# Patient Record
Sex: Male | Born: 1960 | Race: White | Hispanic: No | Marital: Married | Smoking: Former smoker
Health system: Southern US, Community
[De-identification: ages and names within clinical notes are randomized; demographics above are authoritative.]

## PROBLEM LIST (undated history)

## (undated) DIAGNOSIS — E785 Hyperlipidemia, unspecified: Secondary | ICD-10-CM

## (undated) DIAGNOSIS — G709 Myoneural disorder, unspecified: Secondary | ICD-10-CM

## (undated) DIAGNOSIS — M1990 Unspecified osteoarthritis, unspecified site: Secondary | ICD-10-CM

## (undated) DIAGNOSIS — K219 Gastro-esophageal reflux disease without esophagitis: Secondary | ICD-10-CM

## (undated) DIAGNOSIS — I10 Essential (primary) hypertension: Secondary | ICD-10-CM

## (undated) DIAGNOSIS — T7840XA Allergy, unspecified, initial encounter: Secondary | ICD-10-CM

## (undated) HISTORY — PX: ABDOMINAL HERNIA REPAIR: SHX539

## (undated) HISTORY — PX: SHOULDER SURGERY: SHX246

## (undated) HISTORY — DX: Allergy, unspecified, initial encounter: T78.40XA

## (undated) HISTORY — DX: Hyperlipidemia, unspecified: E78.5

## (undated) HISTORY — PX: SHOULDER ARTHROSCOPY: SHX128

## (undated) HISTORY — DX: Unspecified osteoarthritis, unspecified site: M19.90

---

## 1998-09-18 ENCOUNTER — Encounter: Admission: RE | Admit: 1998-09-18 | Discharge: 1998-09-18 | Payer: Self-pay | Admitting: Family Medicine

## 1999-02-24 ENCOUNTER — Encounter: Admission: RE | Admit: 1999-02-24 | Discharge: 1999-02-24 | Payer: Self-pay | Admitting: Family Medicine

## 1999-09-10 ENCOUNTER — Encounter: Admission: RE | Admit: 1999-09-10 | Discharge: 1999-09-10 | Payer: Self-pay | Admitting: Family Medicine

## 1999-10-06 ENCOUNTER — Encounter: Admission: RE | Admit: 1999-10-06 | Discharge: 1999-10-06 | Payer: Self-pay | Admitting: Sports Medicine

## 1999-12-22 ENCOUNTER — Encounter: Admission: RE | Admit: 1999-12-22 | Discharge: 1999-12-22 | Payer: Self-pay | Admitting: Family Medicine

## 2000-10-27 ENCOUNTER — Encounter: Admission: RE | Admit: 2000-10-27 | Discharge: 2000-10-27 | Payer: Self-pay | Admitting: Family Medicine

## 2001-04-18 ENCOUNTER — Encounter: Admission: RE | Admit: 2001-04-18 | Discharge: 2001-04-18 | Payer: Self-pay | Admitting: Family Medicine

## 2001-05-10 ENCOUNTER — Encounter: Admission: RE | Admit: 2001-05-10 | Discharge: 2001-05-10 | Payer: Self-pay | Admitting: Family Medicine

## 2001-05-25 ENCOUNTER — Encounter: Admission: RE | Admit: 2001-05-25 | Discharge: 2001-05-25 | Payer: Self-pay | Admitting: Family Medicine

## 2002-09-04 ENCOUNTER — Encounter: Admission: RE | Admit: 2002-09-04 | Discharge: 2002-09-04 | Payer: Self-pay | Admitting: Family Medicine

## 2002-09-05 ENCOUNTER — Encounter: Admission: RE | Admit: 2002-09-05 | Discharge: 2002-09-05 | Payer: Self-pay | Admitting: Family Medicine

## 2004-09-15 ENCOUNTER — Ambulatory Visit: Payer: Self-pay | Admitting: Family Medicine

## 2004-09-24 ENCOUNTER — Ambulatory Visit: Payer: Self-pay | Admitting: Family Medicine

## 2005-05-04 ENCOUNTER — Ambulatory Visit (HOSPITAL_COMMUNITY): Admission: RE | Admit: 2005-05-04 | Discharge: 2005-05-04 | Payer: Self-pay | Admitting: Family Medicine

## 2005-05-04 ENCOUNTER — Ambulatory Visit: Payer: Self-pay | Admitting: Family Medicine

## 2005-06-09 ENCOUNTER — Ambulatory Visit (HOSPITAL_COMMUNITY): Admission: RE | Admit: 2005-06-09 | Discharge: 2005-06-09 | Payer: Self-pay | Admitting: Sports Medicine

## 2005-12-09 ENCOUNTER — Ambulatory Visit: Payer: Self-pay | Admitting: Family Medicine

## 2006-03-10 DIAGNOSIS — L2089 Other atopic dermatitis: Secondary | ICD-10-CM

## 2006-03-10 DIAGNOSIS — K219 Gastro-esophageal reflux disease without esophagitis: Secondary | ICD-10-CM

## 2007-03-02 ENCOUNTER — Ambulatory Visit: Payer: Self-pay | Admitting: Family Medicine

## 2007-03-02 DIAGNOSIS — I10 Essential (primary) hypertension: Secondary | ICD-10-CM | POA: Insufficient documentation

## 2007-03-02 LAB — CONVERTED CEMR LAB
Bilirubin Urine: NEGATIVE
Blood in Urine, dipstick: NEGATIVE
Ketones, urine, test strip: NEGATIVE
Urobilinogen, UA: 0.2
WBC Urine, dipstick: NEGATIVE
pH: 7

## 2007-06-22 ENCOUNTER — Ambulatory Visit: Payer: Self-pay | Admitting: Family Medicine

## 2007-06-22 DIAGNOSIS — M25519 Pain in unspecified shoulder: Secondary | ICD-10-CM

## 2007-07-17 ENCOUNTER — Telehealth: Payer: Self-pay | Admitting: *Deleted

## 2007-07-20 ENCOUNTER — Ambulatory Visit: Payer: Self-pay | Admitting: Family Medicine

## 2007-07-20 DIAGNOSIS — R252 Cramp and spasm: Secondary | ICD-10-CM | POA: Insufficient documentation

## 2007-07-20 LAB — CONVERTED CEMR LAB
AST: 19 units/L (ref 0–37)
Albumin: 4.6 g/dL (ref 3.5–5.2)
Calcium: 9.6 mg/dL (ref 8.4–10.5)
Glucose, Bld: 87 mg/dL (ref 70–99)
Total Bilirubin: 0.6 mg/dL (ref 0.3–1.2)
Total CHOL/HDL Ratio: 3.6
Total Protein: 7.2 g/dL (ref 6.0–8.3)
VLDL: 25 mg/dL (ref 0–40)

## 2008-03-05 ENCOUNTER — Encounter: Payer: Self-pay | Admitting: Family Medicine

## 2008-03-05 ENCOUNTER — Ambulatory Visit: Payer: Self-pay | Admitting: Family Medicine

## 2008-12-20 ENCOUNTER — Ambulatory Visit: Payer: Self-pay | Admitting: Family Medicine

## 2009-01-31 ENCOUNTER — Ambulatory Visit: Payer: Self-pay | Admitting: Family Medicine

## 2009-02-17 ENCOUNTER — Ambulatory Visit: Payer: Self-pay | Admitting: Family Medicine

## 2009-05-28 ENCOUNTER — Ambulatory Visit: Payer: Self-pay

## 2009-05-28 ENCOUNTER — Ambulatory Visit: Payer: Self-pay | Admitting: Family Medicine

## 2009-05-28 ENCOUNTER — Ambulatory Visit (HOSPITAL_COMMUNITY): Admission: RE | Admit: 2009-05-28 | Discharge: 2009-05-28 | Payer: Self-pay | Admitting: Family Medicine

## 2009-06-02 ENCOUNTER — Encounter: Payer: Self-pay | Admitting: Family Medicine

## 2009-06-02 LAB — CONVERTED CEMR LAB
AST: 17 units/L (ref 0–37)
BUN: 18 mg/dL (ref 6–23)
Bilirubin Urine: NEGATIVE
Blood in Urine, dipstick: NEGATIVE
CO2: 24 meq/L (ref 19–32)
Calcium: 9.1 mg/dL (ref 8.4–10.5)
Chloride: 104 meq/L (ref 96–112)
Glucose, Urine, Semiquant: NEGATIVE
Nitrite: NEGATIVE
PSA: 0.3 ng/mL (ref 0.10–4.00)
Potassium: 4.1 meq/L (ref 3.5–5.3)
Protein, U semiquant: NEGATIVE
Specific Gravity, Urine: 1.02
Total Protein: 6.9 g/dL (ref 6.0–8.3)

## 2009-06-03 ENCOUNTER — Encounter: Payer: Self-pay | Admitting: Family Medicine

## 2009-06-04 ENCOUNTER — Ambulatory Visit (HOSPITAL_COMMUNITY): Admission: RE | Admit: 2009-06-04 | Discharge: 2009-06-04 | Payer: Self-pay | Admitting: Family Medicine

## 2009-06-17 ENCOUNTER — Telehealth: Payer: Self-pay | Admitting: Family Medicine

## 2009-06-18 ENCOUNTER — Telehealth: Payer: Self-pay | Admitting: Family Medicine

## 2009-06-18 DIAGNOSIS — M719 Bursopathy, unspecified: Secondary | ICD-10-CM

## 2009-06-18 DIAGNOSIS — M67919 Unspecified disorder of synovium and tendon, unspecified shoulder: Secondary | ICD-10-CM | POA: Insufficient documentation

## 2009-06-24 ENCOUNTER — Encounter: Payer: Self-pay | Admitting: Family Medicine

## 2009-07-31 ENCOUNTER — Ambulatory Visit: Payer: Self-pay | Admitting: Family Medicine

## 2009-07-31 ENCOUNTER — Telehealth: Payer: Self-pay | Admitting: Family Medicine

## 2009-07-31 DIAGNOSIS — R358 Other polyuria: Secondary | ICD-10-CM

## 2009-07-31 LAB — CONVERTED CEMR LAB
Ketones, urine, test strip: NEGATIVE
Specific Gravity, Urine: 1.02
pH: 6.5

## 2009-11-21 ENCOUNTER — Encounter: Payer: Self-pay | Admitting: Family Medicine

## 2010-02-10 NOTE — Assessment & Plan Note (Signed)
Summary: cpe,df   Vital Signs:  Patient profile:   50 year old male Height:      70 inches Weight:      186 pounds BMI:     26.78 BSA:     2.03 Temp:     98.6 degrees F Pulse rate:   91 / minute BP sitting:   140 / 86  Vitals Entered By: Jone Baseman CMA (Jun 02, 2009 11:47 AM) CC: CPE Is Patient Diabetic? No Pain Assessment Patient in pain? yes     Location: rigth shoulder Intensity: 5   CC:  CPE.  History of Present Illness: Feels well except for shoulder - see notes  HYPERTENSION Disease Monitoring   Blood pressure range:usally controlled at most office vsits and when checks at home       Chest pain: N     Dyspnea:N Medications   Compliance: never started HCTZ that was Rxed 2 years ago   Lightheadedness: N     Edema:N Prevention   Exercise: at work    Has lost 5-10 lbs since diagnosed as hypertension  Reflux controlled. Takes tums as needed.  No bleeding or food sticking or unitentional weight loss  ROS - as above PMH - Medications reviewed and updated in medication list.  Smoking Status noted in VS form      Habits & Providers  Alcohol-Tobacco-Diet     Tobacco Status: never  Current Medications (verified): 1)  None  Allergies: No Known Drug Allergies  Review of Systems  The patient denies anorexia, fever, weight gain, vision loss, decreased hearing, hoarseness, chest pain, syncope, dyspnea on exertion, peripheral edema, prolonged cough, headaches, hemoptysis, abdominal pain, melena, hematochezia, severe indigestion/heartburn, hematuria, incontinence, genital sores, muscle weakness, suspicious skin lesions, transient blindness, difficulty walking, depression, unusual weight change, abnormal bleeding, enlarged lymph nodes, angioedema, and testicular masses.    Physical Exam  General:  Well-developed,well-nourished,in no acute distress; alert,appropriate and cooperative throughout examination Head:  Normocephalic and atraumatic without obvious  abnormalities. No apparent alopecia or balding. Ears:  External ear exam shows no significant lesions or deformities.  Otoscopic examination reveals clear canals, tympanic membranes are intact bilaterally without bulging, retraction, inflammation or discharge. Hearing is grossly normal bilaterally. Nose:  External nasal examination shows no deformity or inflammation. Nasal mucosa are pink and moist without lesions or exudates. Mouth:  Oral mucosa and oropharynx without lesions or exudates.  Teeth in good repair. Neck:  No deformities, masses, or tenderness noted. Lungs:  Normal respiratory effort, chest expands symmetrically. Lungs are clear to auscultation, no crackles or wheezes. Heart:  Normal rate and regular rhythm. S1 and S2 normal without gallop, murmur, click, rub or other extra sounds. Abdomen:  Bowel sounds positive,abdomen soft and non-tender without masses, organomegaly or hernias noted. Rectal:  No external abnormalities noted. Normal sphincter tone. No rectal masses or tenderness. Genitalia:  Testes bilaterally descended without nodularity, tenderness or masses. No scrotal masses or lesions. No penis lesions or urethral discharge. Prostate:  Prostate gland firm and smooth, no enlargement, nodularity, tenderness, mass, asymmetry or induration. Msk:  No deformity or scoliosis noted of thoracic or lumbar spine.   Extremities:  No clubbing, cyanosis, edema, or deformity noted with normal full range of motion of all joints.   Neurologic:  CN 2-7 Normal.  Upper Extremity strength 5/5.  Able to walk on tip toes and heels.  Romberg normal.  Finger to Nose normal  Skin:  Intact without suspicious lesions or rashes Cervical Nodes:  No lymphadenopathy  noted Inguinal Nodes:  No significant adenopathy   Impression & Recommendations:  Problem # 1:  PHYSICAL EXAMINATION (ICD-V70.0)  normal exam.  Filled out drivers form.  Refer for colonoscopy   Orders: [facility] - Est  40-64 yrs  ([address])  Problem # 2:  HYPERTENSION, BENIGN ESSENTIAL (ICD-401.1) seems to be controlled with diet and weight loss .  Will check labs  The following medications were removed from the medication list:    Hydrochlorothiazide 25 Mg Tabs (Hydrochlorothiazide) .Marland Kitchen... Take 1 tab by mouth every morning  Orders: Comp Met-[facility] (60454-09811) Urinalysis-[facility] (00000)  BP today: 140/86 Prior BP: 131/86 (05/28/2009)  Labs Reviewed: K+: 4.7 (07/20/2007) Creat: : 0.80 (07/20/2007)   Chol: 200 (07/20/2007)   HDL: 55 (07/20/2007)   LDL: 120 (07/20/2007)   TG: 126 (07/20/2007)  Other Orders: PSA-[facility] ([id number]) Tdap => 45yrs IM ([address]) Admin 1st Vaccine (57846)  Patient Instructions: 1)  Everything looks healthy 2)  I will send you a letter with your blood test results if you donot hear from me in 2 weeks call us 3)  Keep working on your weight- you've made good progress 4)  Check your blood pressure every so often if regularly > 140/90 come see [place] 5)  You need a colonoscopy - call the number of the sheet   Prevention & Chronic Care Immunizations   Influenza vaccine: Not documented    Tetanus booster: 06/02/2009: Tdap   Tetanus booster due: 09/11/2008    Pneumococcal vaccine: Not documented  Colorectal Screening   Hemoccult: Not documented    Colonoscopy: Not documented  Other Screening   PSA: Not documented   PSA ordered.   Smoking status: never  (06/02/2009)  Lipids   Total Cholesterol: 200  (07/20/2007)   LDL: 120  (07/20/2007)   LDL Direct: Not documented   HDL: 55  (07/20/2007)   Triglycerides: 126  (07/20/2007)  Hypertension   Last Blood Pressure: 140 / 86  (06/02/2009)   Serum creatinine: 0.80  (07/20/2007)   Serum potassium 4.7  (07/20/2007) CMP ordered     Hypertension flowsheet reviewed?: Yes   Progress toward BP goal: At goal  Self-Management Support :    Hypertension self-management support: Not documented   Laboratory Results   Urine  Tests  Date/Time Received: Jun 02, 2009 12:16 PM  Date/Time Reported: Jun 02, 2009 12:57 PM   Routine Urinalysis   Color: yellow Appearance: Clear Glucose: negative   (Normal Range: Negative) Bilirubin: negative   (Normal Range: Negative) Ketone: negative   (Normal Range: Negative) Spec. Gravity: 1.020   (Normal Range: 1.003-1.035) Blood: negative   (Normal Range: Negative) pH: 5.5   (Normal Range: 5.0-8.0) Protein: negative   (Normal Range: Negative) Urobilinogen: 0.2   (Normal Range: 0-1) Nitrite: negative   (Normal Range: Negative) Leukocyte Esterace: negative   (Normal Range: Negative)    Comments: ...............test performed by......Marland KitchenBonnie A. [place], MLS (ASCP)cm      Immunizations Administered:  Tetanus Vaccine:    Vaccine Type: Tdap    Site: left deltoid    Mfr: GlaxoSmithKline    Dose: 0.5 ml    Route: IM    Given by: Gladstone Pih    Exp. Date: 04/05/2011    Lot #: [id number]    VIS given: 11/29/06 version given Jun 02, 2009.    Physician counseled: yes

## 2010-02-10 NOTE — Assessment & Plan Note (Signed)
Summary: U/S SHOULDER,MC   History of Present Illness: 50 y/o male presents with bilateral shoulder pain, R>L.  Right shoulder has hx of trauma from car falling him while working on it in 1983, fell on his side. 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  Patient several cortisone shots in Right shoulder over the years but without much improvement or relief.  Patients pain is a 10/10 at its worst, constant achey pain with occational sharp pains.  Awakens from sleep at night.  Trouble with abduction motions.  Pain increases with prolonged use.  PCP prescribed Diclofenac 75mg  two times a day as needed for pain.  Also states that is sore in trapezius muscles.  Left shoulder is less problematic and is a 2/10 at its worst.    Allergies: No Known Drug Allergies  Review of Systems  The patient denies weight loss and weight gain.    Physical Exam  General:  alert and well-nourished.   Msk:  Bilateral Chest muscles: mild atrophy of pectoralis muscles noted B but intact function. -NT to palpation  Right Shoulder: -Tender to palpation anterior glenohumeral joint  -range of motion limited to 90 degrees in abduction, internal rotation combined with extension limited compared to left side.  -5/5 strength, but pain with all motions -Pain with lift off test and empty can test  Left Shoulder: -NT to palpation -FROM -5/5 strength  Additional Exam:  [place]. Supraspinatus and infraspinatus are intact and normal in apperance. The subscapularis is fatty infiltrated diffusely. No impingement seen on arm motion. Bicep tendon is intact.   Impression & Recommendations:  Problem # 1:  SHOULDER, PAIN (ICD-719.41)  B shoulder pain with intact rotator cuff muscles on clinical exam and [place] of right. . Will start aggressive rotator cuff strengthening program. Also add push ups as he has some slight atrophy of pec major by clinical exam. continue NSAID as needed. RTC 1  m  Orders: US EXTREMITY NON-VASC REAL-TIME IMG ([address])  Complete Medication List: 1)  Cialis 10 Mg Tabs (Tadalafil) .Marland Kitchen.. 1 by mouth a day as needed 2)  Hydrochlorothiazide 25 Mg Tabs (Hydrochlorothiazide) .... Take 1 tab by mouth every morning 3)  Prednisone 20 Mg Tabs (Prednisone) .... Sig: take 2 tabs by mouth once daily for 5 days 4)  Diclofenac Sodium 75 Mg Tbec (Diclofenac sodium) .... One by mouth two times a day for pain as needed - take with food

## 2010-02-10 NOTE — Consult Note (Signed)
Summary: [facility]   Imported By: Clydell Hakim 07/10/2009 16:32:31  _____________________________________________________________________  External Attachment:    Type:   Image     Comment:   External Document

## 2010-02-10 NOTE — Assessment & Plan Note (Signed)
Summary: shoulder pain,tcb   Vital Signs:  Patient profile:   50 year old male Height:      70 inches Weight:      185.1 pounds BMI:     26.66 Temp:     97.4 degrees F oral Pulse rate:   78 / minute BP sitting:   131 / 86  (left arm)  Vitals Entered By: Gladstone Pih (May 28, 2009 8:59 AM) CC: C/O right shoulder pain Is Patient Diabetic? No Pain Assessment Patient in pain? yes     Location: shoulder Intensity: 7 Type: aching   CC:  C/O right shoulder pain.  History of Present Illness: f/u shoulder pain--no better after injection and with use of NSAIDs, HEP. , Patients pain is a 10/10 at its worst, constant achey pain with occational sharp pains.  Awakens from sleep at night.  Trouble with overhead and abduction movements.  Habits & Providers  Alcohol-Tobacco-Diet     Tobacco Status: never  Current Medications (verified): 1)  Cialis 10 Mg  Tabs (Tadalafil) .Marland Kitchen.. 1 By Mouth A Day As Needed 2)  Hydrochlorothiazide 25 Mg  Tabs (Hydrochlorothiazide) .... Take 1 Tab By Mouth Every Morning 3)  Prednisone 20 Mg Tabs (Prednisone) .... Sig: Take 2 Tabs By Mouth Once Daily For 5 Days 4)  Diclofenac Sodium 75 Mg Tbec (Diclofenac Sodium) .... One By Mouth Two Times A Day For Pain As Needed - Take With Food  Allergies: No Known Drug Allergies  Physical Exam  General:  alert and well-developed.   Msk:  R shoulder pain with supraspiatis testing, decreased IR with difficulty performing lift off test on right. Positive Obriens and positive impingement signs Bicep, deltoid muscle strengh iontact.  Distally NV intact   Impression & Recommendations:  Problem # 1:  SHOULDER, PAIN (ICD-719.41)  His updated medication list for this problem includes:    Diclofenac Sodium 75 Mg Tbec (Diclofenac sodium) ..... One by mouth two times a day for pain as needed - take with food  Orders: Radiology other (Radiology Other) [facility]- Est Level  3 ([address]) will get MR Arthrogram to rule out labral  tear and other pathology f/u afte imaging  Complete Medication List: 1)  Cialis 10 Mg Tabs (Tadalafil) .Marland Kitchen.. 1 by mouth a day as needed 2)  Hydrochlorothiazide 25 Mg Tabs (Hydrochlorothiazide) .... Take 1 tab by mouth every morning 3)  Prednisone 20 Mg Tabs (Prednisone) .... Sig: take 2 tabs by mouth once daily for 5 days 4)  Diclofenac Sodium 75 Mg Tbec (Diclofenac sodium) .... One by mouth two times a day for pain as needed - take with food

## 2010-02-10 NOTE — Assessment & Plan Note (Signed)
Summary: "urinary symptoms"/[place]   Vital Signs:  Patient profile:   50 year old male Weight:      189 pounds Temp:     98 degrees F oral Pulse rate:   93 / minute BP sitting:   131 / 92  (right arm)  Vitals Entered By: Arlyss Repress CMA, (July 31, 2009 9:49 AM) CC: increased urination x saturday.  Is Patient Diabetic? No Pain Assessment Patient in pain? no        Primary Care Provider:  Pearlean Brownie MD  CC:  increased urination x saturday. Marland Kitchen  History of Present Illness: 50 yo here for more frequent urination.  Has happened since saturday.  He had day durgery for rotatior cuff repair on Friday under general anesthesia.  Has not had uruinary frequency before.  No incontinence, urge, dysuria, hematuria, flank pain.  he wonders if it could be the pain pills he has gotten.  Habits & Providers  Alcohol-Tobacco-Diet     Tobacco Status: quit     Year Quit: 2004  Current Medications (verified): 1)  None  Allergies: No Known Drug Allergies PMH-FH-SH reviewed-no changes except otherwise noted  Social History: Smoking Status:  quit  Review of Systems      See HPI  Physical Exam  General:  Well-developed,well-nourished,in no acute distress; alert,appropriate and cooperative throughout examination Msk:  right shoulder in sling, steristrips in place over shoulder.   Impression & Recommendations:  Problem # 1:  POLYURIA (ICD-788.42) U/A neg for infection.  Given this is first time he has had symptoms and is immediately post surgery- probably due to post-anesthesia/IVF.  Explained that I didnt think it was due to pain meds.  If continues to have polyuria would come back to see MD to further workup for other causes. Orders: Urinalysis-[facility] (00000) [facility]- Est Level  3 [phone])  Laboratory Results   Urine Tests  Date/Time Received: July 31, 2009 9:59 AM  Date/Time Reported: July 31, 2009 10:07 AM   Routine Urinalysis   Color: yellow Appearance:  Clear Glucose: negative   (Normal Range: Negative) Bilirubin: negative   (Normal Range: Negative) Ketone: negative   (Normal Range: Negative) Spec. Gravity: 1.020   (Normal Range: 1.003-1.035) Blood: negative   (Normal Range: Negative) pH: 6.5   (Normal Range: 5.0-8.0) Protein: negative   (Normal Range: Negative) Urobilinogen: 0.2   (Normal Range: 0-1) Nitrite: negative   (Normal Range: Negative) Leukocyte Esterace: negative   (Normal Range: Negative)    Comments: ...............test performed by......Marland KitchenBonnie A. [place], MLS (ASCP)cm

## 2010-02-10 NOTE — Miscellaneous (Signed)
  Clinical Lists Changes  Problems: Removed problem of PHYSICAL EXAMINATION (ICD-V70.0) Removed problem of CHOLESTEROL, SCREENING (ICD-V77.91) Removed problem of FAMILY HISTORY OF CAD MALE 1ST DEGREE RELATIVE <50 (ICD-V17.3) Removed problem of SCIATICA, ACUTE (ICD-724.3)

## 2010-02-10 NOTE — Letter (Signed)
Summary: Generic Letter  [facility]  [address]   [place], [place] [address]   Phone: [phone]  Fax: [phone]    06/03/2009  David Carrillo [address] [place], [place]  [address]  Dear [name],  Glad to say all your blood tests were normal.     Hope the MRI will show something that can be fixed.   Keep monitoring your blood pressure and weight.  Take care   Sincerely,   Pearlean Brownie MD  Appended Document: Generic Letter mailed.

## 2010-02-10 NOTE — Progress Notes (Signed)
  Phone Note Outgoing Call   Summary of Call: left mssg for him at work the other day and he has not called back left message today on home answering machine for him to call re MRI results  Denny Levy MD  June 17, 2009 10:27 AM

## 2010-02-10 NOTE — Progress Notes (Signed)
       Additional Follow-up for Phone Call Additional follow up Details #2::    staff from his ortho md office called to ask [place] to make an appt for him due to urinary problems. she was not sure of the details. asked her to tell him to come right over to see work in md Follow-up by: Golden Circle RN,  July 31, 2009 9:21 AM

## 2010-02-10 NOTE — Progress Notes (Signed)
Summary: phn msg  Phone Note Call from Patient Call back at Work Phone [phone]   Caller: Patient Summary of Call: Pt returning Dr. Donnetta Hail call regarding MRI results. Initial call taken by: Clydell Hakim,  June 18, 2009 9:51 AM  Follow-up for Phone Call        to Dr Jennette Kettle Follow-up by: Gladstone Pih,  June 18, 2009 11:19 AM  Additional Follow-up for Phone Call Additional follow up Details #1::        spoke w him re MRI--will send to ortho for eval--unclear to me if any surgical intervention would be helpful but he would like to pursue. will set up ortho  Denny Levy MD  June 19, 2009 9:54 AM   New Problems: ROTATOR CUFF INJURY, RIGHT SHOULDER (ICD-726.10)   New Problems: ROTATOR CUFF INJURY, RIGHT SHOULDER (ICD-726.10)

## 2010-02-10 NOTE — Assessment & Plan Note (Signed)
Summary: shoulder pain,tcb   Vital Signs:  Patient profile:   50 year old male Height:      70 inches Weight:      186.9 pounds BMI:     26.91 Temp:     98.0 degrees F oral Pulse rate:   89 / minute BP sitting:   145 / 82  (left arm) Cuff size:   regular  Vitals Entered By: Garen Grams LPN (January 31, 2009 3:17 PM) CC: rt shoulder pain Is Patient Diabetic? No Pain Assessment Patient in pain? yes     Location: rt shoulder Intensity: 3   CC:  rt shoulder pain.  History of Present Illness: 1. R shoulder pain Pt seen 12/10 by me for same issue. States steroid injection helped for a week or so but the pain is persistent and not improving. Has pain mostly with overhead activities and when he lays on that shoulder at night. Takes an occasional BC powder but doesn't notice much of a difference. No other pain medication.   Habits & Providers  Alcohol-Tobacco-Diet     Tobacco Status: never  Allergies: No Known Drug Allergies  Physical Exam  General:  VS reviewed, slightly hypertensive. Well-developed,well-nourished,in no acute distress; alert,appropriate and cooperative throughout examination Msk:  Shoulder: Inspection reveals no abnormalities, atrophy or asymmetry. Palpation is normal with no tenderness over AC joint or bicipital groove. ROM is full in all planes. + pain with empty can. + pain with Hawkins.  + painful arc. No drop arm sign +/- apprehension sign       Impression & Recommendations:  Problem # 1:  SHOULDER, PAIN (ICD-719.41) Assessment Unchanged + signs of impingement. Rotator cuff syndrome vs. chronic arthritis with previous injury. No signs of frozen shoulder at this point. Given chronic issue and failure to improve with injections, will refer to sports medicine. Ultrasound may help to elucidate pathology and guide therapy. Will start NSAID now. Pt has HTN so would not want to continue this beyond a few weeks.   His updated medication list for  this problem includes:    Diclofenac Sodium 75 Mg Tbec (Diclofenac sodium) ..... One by mouth two times a day for pain as needed - take with food  Orders: Sports Medicine (Sports Med) [facility]- Est Level  3 ([address])  Complete Medication List: 1)  Cialis 10 Mg Tabs (Tadalafil) .Marland Kitchen.. 1 by mouth a day as needed 2)  Hydrochlorothiazide 25 Mg Tabs (Hydrochlorothiazide) .... Take 1 tab by mouth every morning 3)  Prednisone 20 Mg Tabs (Prednisone) .... Sig: take 2 tabs by mouth once daily for 5 days 4)  Diclofenac Sodium 75 Mg Tbec (Diclofenac sodium) .... One by mouth two times a day for pain as needed - take with food  Patient Instructions: 1)  Call [phone]) to set  up an appointment with the sports medicine clinic. They may do an ultrasound to get a better idea of what may be causing you pain. 2)  In the meantime, take the diclofenac with pain. Take with food. If it irritates your stomach, call and we can try something different.  Prescriptions: DICLOFENAC SODIUM 75 MG TBEC (DICLOFENAC SODIUM) one by mouth two times a day for pain as needed - take with food  #45 x 0   Entered and Authorized by:   Myrtie Soman  MD   Signed by:   Myrtie Soman  MD on 01/31/2009   Method used:   Electronically to        [facility]  [address] [phone]* (retail)       [address]       [place], [place]  [id number]       Ph: [phone] or [phone]       Fax: [phone]   RxID:   [id number]

## 2011-06-14 ENCOUNTER — Encounter: Payer: Self-pay | Admitting: Family Medicine

## 2011-07-12 ENCOUNTER — Ambulatory Visit (INDEPENDENT_AMBULATORY_CARE_PROVIDER_SITE_OTHER): Payer: 59 | Admitting: Family Medicine

## 2011-07-12 ENCOUNTER — Encounter: Payer: Self-pay | Admitting: Family Medicine

## 2011-07-12 VITALS — BP 139/92 | HR 106 | Temp 98.2°F | Ht 70.0 in | Wt 195.0 lb

## 2011-07-12 DIAGNOSIS — I10 Essential (primary) hypertension: Secondary | ICD-10-CM

## 2011-07-12 LAB — POCT URINALYSIS DIPSTICK
Blood, UA: NEGATIVE
Ketones, UA: NEGATIVE
Nitrite, UA: NEGATIVE
Spec Grav, UA: 1.01
Urobilinogen, UA: 0.2

## 2011-07-12 NOTE — Patient Instructions (Addendum)
If your blood pressure is regularly > 145/95 then come in for a visit  Cutting back on salt and regular exercise and weight loss would bring it down  I will call you if your tests are not good.  Otherwise I will send you a letter.  If you do not hear from me with in 2 weeks please call our office.     Call about the colonoscopy

## 2011-07-13 ENCOUNTER — Encounter: Payer: Self-pay | Admitting: Family Medicine

## 2011-07-13 LAB — BASIC METABOLIC PANEL
BUN: 12 mg/dL (ref 6–23)
CO2: 24 mEq/L (ref 19–32)
Calcium: 9.7 mg/dL (ref 8.4–10.5)
Chloride: 104 mEq/L (ref 96–112)
Creat: 0.88 mg/dL (ref 0.50–1.35)
Glucose, Bld: 77 mg/dL (ref 70–99)

## 2011-07-13 NOTE — Assessment & Plan Note (Signed)
Slightly elevated.  He is not interested in taking medications.  Discussed salt reduction and home monitoring and exercise.  He agrees

## 2011-07-13 NOTE — Progress Notes (Signed)
  Subjective:    Patient ID: David Carrillo, male    DOB: 1960/02/21, 51 y.o.   MRN: [id number]  HPI  Here for CPE.  Feels well no complaints.   Will have shoulder arthroscopy in a few weeks  Hypertension - checks at home and his diastolic is usually in low 90s.  No really watching salt.  Walks but does not exercise as much as should.  Plans to start going to gym     Review of Systems Patient reports no  vision/ hearing changes,anorexia, weight change, fever ,adenopathy, persistant / recurrent hoarseness, swallowing issues, chest pain, edema,persistant / recurrent cough, hemoptysis, dyspnea(rest, exertional, paroxysmal nocturnal), gastrointestinal  bleeding (melena, rectal bleeding), abdominal pain, excessive heart burn, GU symptoms(dysuria, hematuria, pyuria, voiding/incontinence  Issues) syncope, focal weakness, severe memory loss, concerning skin lesions, depression, anxiety, abnormal bruising/bleeding, major joint swelling.      Objective:   Physical Exam Neck:  No deformities, thyromegaly, masses, or tenderness noted.   Supple with full range of motion without pain. Heart - Regular rate and rhythm.  No murmurs, gallops or rubs.    Lungs:  Normal respiratory effort, chest expands symmetrically. Lungs are clear to auscultation, no crackles or wheezes. Abdomen: soft and non-tender without masses, organomegaly or hernias noted.  No guarding or rebound Extremities:  No cyanosis, edema, or deformity noted with good range of motion of all major joints.   Skin:  Intact without suspicious lesions or rashes Ears:  External ear exam shows no significant lesions or deformities.  Otoscopic examination reveals clear canals, tympanic membranes are intact bilaterally without bulging, retraction, inflammation or discharge. Hearing is grossly normal bilaterall        Assessment & Plan:   Normal Exam - will fill out [employer] Form and return to him

## 2011-08-02 ENCOUNTER — Other Ambulatory Visit: Payer: Self-pay | Admitting: Orthopedic Surgery

## 2011-08-05 ENCOUNTER — Encounter (HOSPITAL_BASED_OUTPATIENT_CLINIC_OR_DEPARTMENT_OTHER): Payer: Self-pay | Admitting: *Deleted

## 2011-08-05 NOTE — Progress Notes (Signed)
No labs needed No cardiac or resp problems  

## 2011-08-10 ENCOUNTER — Encounter (HOSPITAL_BASED_OUTPATIENT_CLINIC_OR_DEPARTMENT_OTHER): Payer: Self-pay | Admitting: Certified Registered"

## 2011-08-10 ENCOUNTER — Telehealth: Payer: Self-pay | Admitting: Family Medicine

## 2011-08-10 ENCOUNTER — Encounter (HOSPITAL_BASED_OUTPATIENT_CLINIC_OR_DEPARTMENT_OTHER): Payer: Self-pay

## 2011-08-10 ENCOUNTER — Ambulatory Visit (HOSPITAL_BASED_OUTPATIENT_CLINIC_OR_DEPARTMENT_OTHER): Payer: Worker's Compensation | Admitting: Certified Registered"

## 2011-08-10 ENCOUNTER — Ambulatory Visit (HOSPITAL_BASED_OUTPATIENT_CLINIC_OR_DEPARTMENT_OTHER)
Admission: RE | Admit: 2011-08-10 | Discharge: 2011-08-10 | Disposition: A | Discharge status: Home / Self Care | Payer: Worker's Compensation | Source: Ambulatory Visit | Attending: Orthopedic Surgery | Admitting: Orthopedic Surgery

## 2011-08-10 ENCOUNTER — Encounter (HOSPITAL_BASED_OUTPATIENT_CLINIC_OR_DEPARTMENT_OTHER)
Admission: RE | Disposition: A | Discharge status: Home / Self Care | Payer: Self-pay | Source: Ambulatory Visit | Attending: Orthopedic Surgery

## 2011-08-10 DIAGNOSIS — K219 Gastro-esophageal reflux disease without esophagitis: Secondary | ICD-10-CM | POA: Insufficient documentation

## 2011-08-10 DIAGNOSIS — I10 Essential (primary) hypertension: Secondary | ICD-10-CM | POA: Insufficient documentation

## 2011-08-10 DIAGNOSIS — M719 Bursopathy, unspecified: Secondary | ICD-10-CM | POA: Insufficient documentation

## 2011-08-10 DIAGNOSIS — M67919 Unspecified disorder of synovium and tendon, unspecified shoulder: Secondary | ICD-10-CM | POA: Insufficient documentation

## 2011-08-10 HISTORY — DX: Essential (primary) hypertension: I10

## 2011-08-10 HISTORY — DX: Myoneural disorder, unspecified: G70.9

## 2011-08-10 HISTORY — DX: Gastro-esophageal reflux disease without esophagitis: K21.9

## 2011-08-10 LAB — POCT HEMOGLOBIN-HEMACUE: Hemoglobin: 16 g/dL (ref 13.0–17.0)

## 2011-08-10 SURGERY — ARTHROSCOPY, SHOULDER, WITH ROTATOR CUFF REPAIR
Anesthesia: General | Site: Shoulder | Laterality: Right | Wound class: Clean

## 2011-08-10 MED ORDER — LACTATED RINGERS IV SOLN
INTRAVENOUS | Status: DC
  Administered 2011-08-10 (×2): via INTRAVENOUS

## 2011-08-10 MED ORDER — SUCCINYLCHOLINE CHLORIDE 20 MG/ML IJ SOLN
INTRAMUSCULAR | Status: DC | PRN
  Administered 2011-08-10: 80 mg via INTRAVENOUS

## 2011-08-10 MED ORDER — FENTANYL CITRATE 0.05 MG/ML IJ SOLN
INTRAMUSCULAR | Status: DC | PRN
  Administered 2011-08-10: 50 ug via INTRAVENOUS

## 2011-08-10 MED ORDER — MIDAZOLAM HCL 2 MG/2ML IJ SOLN
1.0000 mg | INTRAMUSCULAR | Status: AC | PRN
  Administered 2011-08-10 (×2): 1 mg via INTRAVENOUS

## 2011-08-10 MED ORDER — OXYCODONE-ACETAMINOPHEN 5-325 MG PO TABS: 1.0000 | ORAL_TABLET | ORAL | Status: AC | PRN

## 2011-08-10 MED ORDER — CEFAZOLIN SODIUM-DEXTROSE 2-3 GM-% IV SOLR
2.0000 g | INTRAVENOUS | Status: AC
  Administered 2011-08-10: 2 g via INTRAVENOUS

## 2011-08-10 MED ORDER — DEXAMETHASONE SODIUM PHOSPHATE 4 MG/ML IJ SOLN
INTRAMUSCULAR | Status: DC | PRN
  Administered 2011-08-10: 6 mg via INTRAVENOUS

## 2011-08-10 MED ORDER — SODIUM CHLORIDE 0.9 % IR SOLN
Status: DC | PRN
  Administered 2011-08-10: 12000 mL

## 2011-08-10 MED ORDER — PROPOFOL 10 MG/ML IV EMUL
INTRAVENOUS | Status: DC | PRN
  Administered 2011-08-10: 30 mg via INTRAVENOUS
  Administered 2011-08-10: 200 mg via INTRAVENOUS

## 2011-08-10 MED ORDER — HYDROMORPHONE HCL PF 1 MG/ML IJ SOLN
0.2500 mg | INTRAMUSCULAR | Status: DC | PRN
Start: 2011-08-10 — End: 2011-08-10

## 2011-08-10 MED ORDER — FENTANYL CITRATE 0.05 MG/ML IJ SOLN
50.0000 ug | INTRAMUSCULAR | Status: AC | PRN
  Administered 2011-08-10 (×2): 50 ug via INTRAVENOUS

## 2011-08-10 MED ORDER — BUPIVACAINE-EPINEPHRINE PF 0.5-1:200000 % IJ SOLN
INTRAMUSCULAR | Status: DC | PRN
  Administered 2011-08-10: 30 mL

## 2011-08-10 MED ORDER — ONDANSETRON HCL 4 MG/2ML IJ SOLN
INTRAMUSCULAR | Status: DC | PRN
  Administered 2011-08-10: 4 mg via INTRAVENOUS

## 2011-08-10 MED ORDER — LIDOCAINE HCL (CARDIAC) 20 MG/ML IV SOLN
INTRAVENOUS | Status: DC | PRN
  Administered 2011-08-10: 20 mg via INTRAVENOUS

## 2011-08-10 MED ORDER — POVIDONE-IODINE 7.5 % EX SOLN: Freq: Once | CUTANEOUS | Status: DC

## 2011-08-10 MED ORDER — ONDANSETRON HCL 4 MG/2ML IJ SOLN: 4.0000 mg | Freq: Four times a day (QID) | INTRAMUSCULAR | Status: DC | PRN

## 2011-08-10 SURGICAL SUPPLY — 94 items
ADH SKN CLS APL DERMABOND .7 (GAUZE/BANDAGES/DRESSINGS)
ANCH SUT 2 FT CRKSW 14.7X5.5 (Anchor) ×2 IMPLANT
ANCH SUT PUSHLCK 24X4.5 STRL (Orthopedic Implant) ×1 IMPLANT
ANCHOR CORKSCREW FIBER 5.5X15 (Anchor) ×2 IMPLANT
APL SKNCLS STERI-STRIP NONHPOA (GAUZE/BANDAGES/DRESSINGS)
BENZOIN TINCTURE PRP APPL 2/3 (GAUZE/BANDAGES/DRESSINGS) IMPLANT
BLADE 4.2CUDA (BLADE) IMPLANT
BLADE CUDA 5.5 (BLADE) IMPLANT
BLADE CUTTER GATOR 3.5 (BLADE) IMPLANT
BLADE GREAT WHITE 4.2 (BLADE) IMPLANT
BLADE SURG 15 STRL LF DISP TIS (BLADE) IMPLANT
BLADE SURG 15 STRL SS (BLADE)
BLADE SURG ROTATE 9660 (MISCELLANEOUS) IMPLANT
BUR 3.5 LG SPHERICAL (BURR) IMPLANT
BUR OVAL 4.0 (BURR) ×2 IMPLANT
BUR OVAL 6.0 (BURR) IMPLANT
BURR 3.5 LG SPHERICAL (BURR)
CANISTER OMNI JUG 16 LITER (MISCELLANEOUS) ×2 IMPLANT
CANISTER SUCTION 2500CC (MISCELLANEOUS) IMPLANT
CANNULA 5.75X71 LONG (CANNULA) ×2 IMPLANT
CANNULA TWIST IN 8.25X7CM (CANNULA) ×1 IMPLANT
CHLORAPREP W/TINT 26ML (MISCELLANEOUS) ×2 IMPLANT
CLOTH BEACON ORANGE TIMEOUT ST (SAFETY) ×2 IMPLANT
DECANTER SPIKE VIAL GLASS SM (MISCELLANEOUS) IMPLANT
DERMABOND ADVANCED (GAUZE/BANDAGES/DRESSINGS)
DERMABOND ADVANCED .7 DNX12 (GAUZE/BANDAGES/DRESSINGS) IMPLANT
DRAPE INCISE IOBAN 66X45 STRL (DRAPES) ×2 IMPLANT
DRAPE STERI 35X30 U-POUCH (DRAPES) ×2 IMPLANT
DRAPE SURG 17X23 STRL (DRAPES) ×2 IMPLANT
DRAPE U 20/CS (DRAPES) ×2 IMPLANT
DRAPE U-SHAPE 47X51 STRL (DRAPES) ×2 IMPLANT
DRAPE U-SHAPE 76X120 STRL (DRAPES) ×4 IMPLANT
DRSG PAD ABDOMINAL 8X10 ST (GAUZE/BANDAGES/DRESSINGS) ×2 IMPLANT
ELECT REM PT RETURN 9FT ADLT (ELECTROSURGICAL)
ELECTRODE REM PT RTRN 9FT ADLT (ELECTROSURGICAL) ×1 IMPLANT
GAUZE SPONGE 4X4 16PLY XRAY LF (GAUZE/BANDAGES/DRESSINGS) IMPLANT
GAUZE XEROFORM 1X8 LF (GAUZE/BANDAGES/DRESSINGS) ×2 IMPLANT
GLOVE BIO SURGEON STRL SZ 6.5 (GLOVE) ×1 IMPLANT
GLOVE BIO SURGEON STRL SZ7.5 (GLOVE) ×4 IMPLANT
GLOVE BIOGEL PI IND STRL 7.0 (GLOVE) IMPLANT
GLOVE BIOGEL PI IND STRL 8 (GLOVE) ×3 IMPLANT
GLOVE BIOGEL PI INDICATOR 7.0 (GLOVE) ×1
GLOVE BIOGEL PI INDICATOR 8 (GLOVE) ×1
GOWN PREVENTION PLUS XLARGE (GOWN DISPOSABLE) ×4 IMPLANT
LASSO CRESCENT QUICKPASS (SUTURE) ×1 IMPLANT
NDL 1/2 CIR CATGUT .05X1.09 (NEEDLE) IMPLANT
NDL SCORPION MULTI FIRE (NEEDLE) IMPLANT
NDL SUT 6 .5 CRC .975X.05 MAYO (NEEDLE) IMPLANT
NEEDLE 1/2 CIR CATGUT .05X1.09 (NEEDLE) IMPLANT
NEEDLE MAYO TAPER (NEEDLE)
NEEDLE SCORPION MULTI FIRE (NEEDLE) ×2 IMPLANT
NS IRRIG 1000ML POUR BTL (IV SOLUTION) IMPLANT
PACK ARTHROSCOPY DSU (CUSTOM PROCEDURE TRAY) ×2 IMPLANT
PACK BASIN DAY SURGERY FS (CUSTOM PROCEDURE TRAY) ×2 IMPLANT
PENCIL BUTTON HOLSTER BLD 10FT (ELECTRODE) IMPLANT
PUSHLOCK PEEK 4.5X24 (Orthopedic Implant) ×1 IMPLANT
RESECTOR FULL RADIUS 4.2MM (BLADE) ×2 IMPLANT
RESECTOR FULL RADIUS 4.8MM (BLADE) IMPLANT
SHEET MEDIUM DRAPE 40X70 STRL (DRAPES) ×2 IMPLANT
SLEEVE SCD COMPRESS KNEE MED (MISCELLANEOUS) ×2 IMPLANT
SLING ARM FOAM STRAP LRG (SOFTGOODS) ×1 IMPLANT
SLING ARM FOAM STRAP MED (SOFTGOODS) IMPLANT
SLING ARM FOAM STRAP XLG (SOFTGOODS) IMPLANT
SLING ARM IMMOBILIZER MED (SOFTGOODS) IMPLANT
SPONGE GAUZE 4X4 12PLY (GAUZE/BANDAGES/DRESSINGS) ×2 IMPLANT
SPONGE LAP 4X18 X RAY DECT (DISPOSABLE) IMPLANT
STRIP CLOSURE SKIN 1/2X4 (GAUZE/BANDAGES/DRESSINGS) IMPLANT
SUCTION FRAZIER TIP 10 FR DISP (SUCTIONS) IMPLANT
SUPPORT WRAP ARM LG (MISCELLANEOUS) ×1 IMPLANT
SUT 2 FIBERLOOP 20 STRT BLUE (SUTURE)
SUT BONE WAX W31G (SUTURE) IMPLANT
SUT ETHIBOND 2 OS 4 DA (SUTURE) IMPLANT
SUT ETHILON 3 0 PS 1 (SUTURE) ×1 IMPLANT
SUT ETHILON 4 0 PS 2 18 (SUTURE) ×2 IMPLANT
SUT FIBERWIRE #2 38 T-5 BLUE (SUTURE)
SUT FIBERWIRE 2-0 18 17.9 3/8 (SUTURE)
SUT MNCRL AB 3-0 PS2 18 (SUTURE) IMPLANT
SUT MNCRL AB 4-0 PS2 18 (SUTURE) IMPLANT
SUT PDS AB 0 CT 36 (SUTURE) ×1 IMPLANT
SUT PROLENE 3 0 PS 2 (SUTURE) ×1 IMPLANT
SUT VIC AB 0 CT1 18XCR BRD 8 (SUTURE) IMPLANT
SUT VIC AB 0 CT1 8-18 (SUTURE)
SUT VIC AB 2-0 SH 18 (SUTURE) IMPLANT
SUTURE 2 FIBERLOOP 20 STRT BLU (SUTURE) IMPLANT
SUTURE FIBERWR #2 38 T-5 BLUE (SUTURE) IMPLANT
SUTURE FIBERWR 2-0 18 17.9 3/8 (SUTURE) IMPLANT
SYR BULB 3OZ (MISCELLANEOUS) IMPLANT
TOWEL OR 17X24 6PK STRL BLUE (TOWEL DISPOSABLE) ×2 IMPLANT
TOWEL OR NON WOVEN STRL DISP B (DISPOSABLE) ×2 IMPLANT
TUBE CONNECTING 20X1/4 (TUBING) ×2 IMPLANT
TUBING ARTHROSCOPY IRRIG 16FT (MISCELLANEOUS) ×2 IMPLANT
WAND STAR VAC 90 (SURGICAL WAND) ×2 IMPLANT
WATER STERILE IRR 1000ML POUR (IV SOLUTION) ×1 IMPLANT
YANKAUER SUCT BULB TIP NO VENT (SUCTIONS) IMPLANT

## 2011-08-10 NOTE — Progress Notes (Signed)
Assisted Dr. Hodierne with right, ultrasound guided, interscalene  block. Side rails up, monitors on throughout procedure. See vital signs in flow sheet. Tolerated Procedure well. 

## 2011-08-10 NOTE — Op Note (Signed)
Procedure(s): SHOULDER ARTHROSCOPY WITH ROTATOR CUFF REPAIR Procedure Note  David Carrillo male 51 y.o. 08/10/2011  Procedure(s) and Anesthesia Type:    * Right SHOULDER ARTHROSCOPY WITH revision ROTATOR CUFF REPAIR including subscapularis and supraspinatus - General with preoperative interscalene block  Postoperative diagnosis: Right shoulder longitudinal split tear of the subscapularis tendon with elevation off of the lesser tuberosity. High-grade recurrent tear of the supraspinatus.  Surgeon(s) and Role:    * Mable Paris, MD - Primary     Surgeon: Mable Paris   Assistants: None  Anesthesia: General endotracheal anesthesia with preoperative interscalene block    Procedure Detail  SHOULDER ARTHROSCOPY WITH ROTATOR CUFF REPAIR  Estimated Blood Loss: Min         Drains: none  Blood Given: none         Specimens: none        Complications:  * No complications entered in OR log *         Disposition: PACU - hemodynamically stable.         Condition: stable    Procedure:   INDICATIONS FOR SURGERY: The patient is 51 y.o. male who had a prior right shoulder rotator cuff repair within the past 3-4 years. He had an injury at work about 3 months ago to the right shoulder. An MRI showed recurrent rotator cuff tear which was felt to be full-thickness or near full-thickness. He was indicated for surgical treatment to try and re repair the tendon to decrease his pain and prevent increase in tear size. He understood risks benefits alternatives to the procedure including but not limited to risk of bleeding infection damage to neurovascular structures, risk of incomplete tendon healing, or re tear. He elected to go forward with surgery.  OPERATIVE FINDINGS: Examination under anesthesia: No stiffness or instability Diagnostic Arthroscopy:  Glenoid articular cartilage: Intact Humeral head articular cartilage: Intact Labrum: Intact, long head biceps  intact Loose bodies: None Synovitis: Minimal rotator cuff: He had a longitudinal split tear of the subscapularis tendon with elevation of approximately 50% of the tendon off of the lesser tuberosity. This was repaired using one 5.5 mm peak suture anchor with 2 sutures. The articular side of the supraspinatus was noted to have significant thinning anteriorly. This was tagged with a PDS suture. From the bursal side was felt to be very thin and therefore was taken down and repaired with one 5.5 mm peak suture anchor in the medial row with a 4.5 mm peak push an anchor in a lateral row.    DESCRIPTION OF PROCEDURE: The patient was identified in preoperative  holding area where I personally marked the operative site after  verifying site, side, and procedure with the patient. An interscalene block was given by the attending anesthesiologist the holding area.  The patient was taken back to the operating room where general anesthesia was induced without complication and was placed in the beach-chair position with the back  elevated about 60 degrees and all extremities and head and neck carefully padded and  positioned.   The right upper extremity was then prepped and  draped in a standard sterile fashion. The appropriate time-out  procedure was carried out. The patient did receive IV antibiotics  within 30 minutes of incision.   A small posterior portal incision was made and the arthroscope was introduced into the joint. An anterior portal was then established above the subscapularis using needle localization. Small cannula was placed anteriorly. Diagnostic arthroscopy was then carried out with findings  as described above.  He was noted to have a longitudinal split tear in the subscapularis. On closer examination the upper portion was detached from the lesser tuberosity but not retracted. Therefore felt repair would be appropriate. Therefore the larger cannula was placed anteriorly and a small cannula  was placed in the high lateral anterior interval portal. The lesser tuberosity was prepared with ArthroCare bur down to bleeding bone and a 5.5 mm peak anchor was placed through the anterior cannula. The sutures were passed using a crescent suture passer using one suture in a horizontal mattress through the upper portion of the tendon and a second suture in a simple stitch for the upper rolled border. This repair the subscapularis nicely and after the repair it appeared anatomic. Attention was then turned to the supraspinatus which is noted to have significant thinning anteriorly although no full-thickness tear was noted. The posterior cuff was intact. Therefore I percutaneously placed a spinal needle through the area of thin tendon and passed a PDS suture. This was retrieved through the joint and tagged.  The arthroscope was then introduced into the subacromial space a standard lateral portal was established with needle localization. The PDS suture was identified. There was not significant bursitis in the subacromial space. The area of the PDS suture was probed carefully with the contents of the shaver. This area was felt to be extremely thin. Given his MRI findings, clinical exam, and arthroscopic findings I felt that a takedown and repair of the tendon would be appropriate. Therefore I used an 11 blade through the lateral portal to complete the tear. The shaver and ArthriCare used to expose the tuberosity in this region. I did visualize the 2 anchors from the previous repair there were intact and left in place. The bur was used to get down to bleeding bone the tuberosity. A 5.5 mm peak corkscrew anchor was used percutaneously off the lateral edge of the acromion in a medial row and the sutures were passed sequentially posterior to anterior in a horizontal mattress fashion through the medial healthy tendon. Once these were tied down all 4 sutures were placed through 4.5 mm push lock anchor into a lateral row  bringing the tendon nicely down to the prepared tuberosity. The repair was viewed from posterior lateral portals and felt to be excellent. There is not felt to be residual impingement and the acromion was left alone.   The arthroscopic equipment was removed from the joint and the portals were closed with 3-0 nylon in an interrupted fashion. Sterile dressings were then applied including Xeroform 4 x 4's ABDs and tape. The patient was then allowed to awaken from general anesthesia, placed in a sling, transferred to the stretcher and taken to the recovery room in stable condition.   POSTOPERATIVE PLAN: The patient will be discharged home today and will followup in one week for suture removal and wound check.  He will follow the standard cuff protocol.

## 2011-08-10 NOTE — H&P (Signed)
David Carrillo is an 51 y.o. male.   Chief Complaint: R shoulder pain after injury at work HPI: R shoulder recurrent rotator cuff tear after injury at work 3 months ago.  Past Medical History  Diagnosis Date  . Hypertension     borderline-no meds  . Neuromuscular disorder     some numbness rt hand-7/13  . GERD (gastroesophageal reflux disease)     Past Surgical History  Procedure Date  . Shoulder arthroscopy 3 years ago    History reviewed. No pertinent family history. Social History:  reports that he quit smoking about 9 years ago. He does not have any smokeless tobacco history on file. He reports that he does not drink alcohol or use illicit drugs.  Allergies: No Known Allergies  Medications Prior to Admission  Medication Sig Dispense Refill  . omeprazole (PRILOSEC) 20 MG capsule Take 20 mg by mouth daily.      Marland Kitchen aspirin 81 MG tablet Take 81 mg by mouth daily.        Results for orders placed during the hospital encounter of 08/10/11 (from the past 48 hour(s))  POCT HEMOGLOBIN-HEMACUE     Status: Normal   Collection Time   08/10/11  1:07 PM      Component Value Range Comment   Hemoglobin 16.0  13.0 - 17.0 g/dL    No results found.  Review of Systems  All other systems reviewed and are negative.    Blood pressure 116/91, pulse 75, temperature 98 F (36.7 C), temperature source Oral, resp. rate 16, height 5\' 11"  (1.803 m), weight 86.183 kg (190 lb), SpO2 100.00%. Physical Exam  Constitutional: He is oriented to person, place, and time. He appears well-developed and well-nourished.  HENT:  Head: Atraumatic.  Eyes: EOM are normal.  Cardiovascular: Intact distal pulses.   Respiratory: Effort normal.  Musculoskeletal:       Right shoulder: He exhibits tenderness and decreased strength.  Neurological: He is alert and oriented to person, place, and time.  Skin: Skin is warm and dry.  Psychiatric: He has a normal mood and affect.     Assessment/Plan R shoulder  recurrent rotator cuff tear after injury at work 3 months ago. Plan will attempt revision rotator cuff repair R shoulder Risks / benefits of surgery discussed Consent on chart  NPO for OR Preop antibiotics   David Carrillo 08/10/2011, 1:45 PM

## 2011-08-10 NOTE — Anesthesia Postprocedure Evaluation (Signed)
  Anesthesia Post-op Note  Patient: David Carrillo  Procedure(s) Performed: Procedure(s) (LRB): SHOULDER ARTHROSCOPY WITH ROTATOR CUFF REPAIR (Right)  Patient Location: PACU  Anesthesia Type: GA combined with regional for post-op pain  Level of Consciousness: awake  Airway and Oxygen Therapy: Patient Spontanous Breathing  Post-op Pain: mild  Post-op Assessment: Post-op Vital signs reviewed  Post-op Vital Signs: stable  Complications: No apparent anesthesia complications

## 2011-08-10 NOTE — Anesthesia Preprocedure Evaluation (Signed)
Anesthesia Evaluation  Patient identified by MRN, date of birth, ID band Patient awake    Reviewed: Allergy & Precautions, H&P , NPO status , Patient's Chart, lab work & pertinent test results  Airway Mallampati: II  Neck ROM: full    Dental   Pulmonary former smoker,          Cardiovascular hypertension,     Neuro/Psych  Neuromuscular disease    GI/Hepatic GERD-  ,  Endo/Other    Renal/GU      Musculoskeletal   Abdominal   Peds  Hematology   Anesthesia Other Findings   Reproductive/Obstetrics                           Anesthesia Physical Anesthesia Plan  ASA: II  Anesthesia Plan: General and Regional   Post-op Pain Management: MAC Combined w/ Regional for Post-op pain   Induction: Intravenous  Airway Management Planned: Oral ETT  Additional Equipment:   Intra-op Plan:   Post-operative Plan: Extubation in OR  Informed Consent: I have reviewed the patients History and Physical, chart, labs and discussed the procedure including the risks, benefits and alternatives for the proposed anesthesia with the patient or authorized representative who has indicated his/her understanding and acceptance.     Plan Discussed with: CRNA and Surgeon  Anesthesia Plan Comments:         Anesthesia Quick Evaluation

## 2011-08-10 NOTE — Telephone Encounter (Signed)
Please call Mrs. Budney regarding some forms you were to complete for him and inform when ready for pickup.

## 2011-08-10 NOTE — Transfer of Care (Signed)
Immediate Anesthesia Transfer of Care Note  Patient: David Carrillo  Procedure(s) Performed: Procedure(s) (LRB): SHOULDER ARTHROSCOPY WITH ROTATOR CUFF REPAIR (Right)  Patient Location: PACU  Anesthesia Type: GA combined with regional for post-op pain  Level of Consciousness: awake, alert , oriented and patient cooperative  Airway & Oxygen Therapy: Patient Spontanous Breathing and Patient connected to face mask oxygen  Post-op Assessment: Report given to PACU RN and Post -op Vital signs reviewed and stable  Post vital signs: Reviewed and stable  Complications: No apparent anesthesia complications

## 2011-08-10 NOTE — Anesthesia Procedure Notes (Addendum)
Anesthesia Regional Block:   Narrative:    Anesthesia Regional Block:  Interscalene brachial plexus block  Pre-Anesthetic Checklist: ,, timeout performed, Correct Patient, Correct Site, Correct Laterality, Correct Procedure, Correct Position, site marked, Risks and benefits discussed,  Surgical consent,  Pre-op evaluation,  At surgeon's request and post-op pain management  Laterality: Right  Prep: chloraprep       Needles:  Injection technique: Single-shot  Needle Type: Echogenic Stimulator Needle     Needle Length: 5cm 5 cm Needle Gauge: 22 and 22 G    Additional Needles:  Procedures: ultrasound guided and nerve stimulator Interscalene brachial plexus block  Nerve Stimulator or Paresthesia:  Response: biceps flexion, 0.45 mA,   Additional Responses:   Narrative:  Start time: 08/10/2011 1:33 PM End time: 08/10/2011 1:45 PM Injection made incrementally with aspirations every 5 mL.  Performed by: Personally  Anesthesiologist: Dr Chaney Malling  Additional Notes: Functioning IV was confirmed and monitors were applied.  A 50mm 22ga Arrow echogenic stimulator needle was used. Sterile prep and drape,hand hygiene and sterile gloves were used.  Negative aspiration and negative test dose prior to incremental administration of local anesthetic. The patient tolerated the procedure well.  Ultrasound guidance: relevent anatomy identified, needle position confirmed, local anesthetic spread visualized around nerve(s), vascular puncture avoided.  Image printed for medical record.   Interscalene brachial plexus block Procedure Name: Intubation Date/Time: 08/10/2011 2:05 PM Performed by: Verlan Friends Pre-anesthesia Checklist: Patient identified, Emergency Drugs available, Suction available, Patient being monitored and Timeout performed Patient Re-evaluated:Patient Re-evaluated prior to inductionOxygen Delivery Method: Circle System Utilized Preoxygenation: Pre-oxygenation with 100%  oxygen Intubation Type: IV induction Ventilation: Mask ventilation without difficulty Laryngoscope Size: Miller and 3 Grade View: Grade I Tube type: Oral Tube size: 8.0 mm Number of attempts: 1 Airway Equipment and Method: stylet and oral airway Placement Confirmation: ETT inserted through vocal cords under direct vision,  positive ETCO2 and breath sounds checked- equal and bilateral Secured at: 21 cm Tube secured with: Tape Dental Injury: Teeth and Oropharynx as per pre-operative assessment     Date/Time: 08/10/2011 2:05 PM Performed by: Verlan Friends Comments: Patient's  Teeth in poor condition.  Pt. Removed max. Partial. Partial placed in denture cup, cup marked and to follow to PACU

## 2011-08-11 NOTE — Telephone Encounter (Signed)
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   Im pretty sure i mailed them to him directly along with his lab letter.  Please check in the to be picked up file in case it is there.  If not let them know I mailed them.   I can fill out another copy when i return if they need it  Thanks  LC

## 2012-02-23 ENCOUNTER — Encounter: Payer: Self-pay | Admitting: Family Medicine

## 2012-02-23 ENCOUNTER — Ambulatory Visit (INDEPENDENT_AMBULATORY_CARE_PROVIDER_SITE_OTHER): Payer: 59 | Admitting: Family Medicine

## 2012-02-23 VITALS — BP 154/91 | HR 99 | Temp 98.6°F | Ht 71.0 in | Wt 204.0 lb

## 2012-02-23 DIAGNOSIS — I10 Essential (primary) hypertension: Secondary | ICD-10-CM

## 2012-02-23 MED ORDER — LISINOPRIL 10 MG PO TABS: 10.0000 mg | ORAL_TABLET | Freq: Every day | ORAL | Status: DC

## 2012-02-23 NOTE — Assessment & Plan Note (Signed)
Not controlled.  Start acei.  Check bmet in 1-2 weeks.  Work on [employer]

## 2012-02-23 NOTE — Progress Notes (Signed)
  Subjective:    Patient ID: David Carrillo, male    DOB: 1960-02-13, 52 y.o.   MRN: [id number]  HPI  HYPERTENSION Disease Monitoring Home BP Monitoring Usually 150/90-100s Chest pain- no    Dyspnea- no Medications Compliance-  No medications tryijg to watch diet. Lightheadedness-  no  Edema- no ROS - See HPI  PMH Lab Review   Potassium  Date Value Range Status  07/12/2011 4.0  3.5 - 5.3 mEq/L Final     Sodium  Date Value Range Status  07/12/2011 138  135 - 145 mEq/L Final     Creat  Date Value Range Status  07/12/2011 0.88  0.50 - 1.35 mg/dL Final     Creatinine, Ser  Date Value Range Status  06/02/2009 0.87  0.40-1.50 mg/dL Final       PMH - tried diuretic years ago made him feel ill     Review of Systems     Objective:   Physical Exam  Alert no acute distress       Assessment & Plan:

## 2012-02-23 NOTE — Patient Instructions (Addendum)
Come back for a blood test in 1-2 weeks Call the day before   If you have any rash or swelling call me  If your blood pressure is not usually less than 140/90 after taking the lisinopril for 2-3 weeks then call  Weighing less would help with the blood pressure  Call and schedule a colonoscopy    Come back in 2 months and bring your blood pressure cuff

## 2012-02-28 ENCOUNTER — Encounter: Payer: Self-pay | Admitting: Internal Medicine

## 2012-03-29 ENCOUNTER — Ambulatory Visit (AMBULATORY_SURGERY_CENTER): Payer: 59

## 2012-03-29 ENCOUNTER — Encounter: Payer: Self-pay | Admitting: Internal Medicine

## 2012-03-29 VITALS — Ht 70.0 in | Wt 199.0 lb

## 2012-03-29 DIAGNOSIS — Z1211 Encounter for screening for malignant neoplasm of colon: Secondary | ICD-10-CM

## 2012-03-29 MED ORDER — MOVIPREP 100 G PO SOLR: 1.0000 | Freq: Once | ORAL | Status: DC

## 2012-04-11 ENCOUNTER — Ambulatory Visit (AMBULATORY_SURGERY_CENTER): Payer: 59 | Admitting: Internal Medicine

## 2012-04-11 ENCOUNTER — Encounter: Payer: Self-pay | Admitting: Internal Medicine

## 2012-04-11 VITALS — BP 121/88 | HR 71 | Temp 97.2°F | Resp 10 | Ht 70.0 in | Wt 199.0 lb

## 2012-04-11 DIAGNOSIS — K635 Polyp of colon: Secondary | ICD-10-CM

## 2012-04-11 DIAGNOSIS — Z1211 Encounter for screening for malignant neoplasm of colon: Secondary | ICD-10-CM

## 2012-04-11 DIAGNOSIS — D126 Benign neoplasm of colon, unspecified: Secondary | ICD-10-CM

## 2012-04-11 MED ORDER — SODIUM CHLORIDE 0.9 % IV SOLN: 500.0000 mL | INTRAVENOUS | Status: DC

## 2012-04-11 NOTE — Progress Notes (Signed)
Patient did not experience any of the following events: a burn prior to discharge; a fall within the facility; wrong site/side/patient/procedure/implant event; or a hospital transfer or hospital admission upon discharge from the facility. (G8907)Patient did not have preoperative order for IV antibiotic SSI prophylaxis. (G8918) ewm 

## 2012-04-11 NOTE — Patient Instructions (Addendum)
YOU HAD AN ENDOSCOPIC PROCEDURE TODAY AT THE [facility]: Refer to the procedure report that was given to you for any specific questions about what was found during the examination.  If the procedure report does not answer your questions, please call your gastroenterologist to clarify.  If you requested that your care partner not be given the details of your procedure findings, then the procedure report has been included in a sealed envelope for you to review at your convenience later.  YOU SHOULD EXPECT: Some feelings of bloating in the abdomen. Passage of more gas than usual.  Walking can help get rid of the air that was put into your GI tract during the procedure and reduce the bloating. If you had a lower endoscopy (such as a colonoscopy or flexible sigmoidoscopy) you may notice spotting of blood in your stool or on the toilet paper. If you underwent a bowel prep for your procedure, then you may not have a normal bowel movement for a few days.  DIET: Your first meal following the procedure should be a light meal and then it is ok to progress to your normal diet.  A half-sandwich or bowl of soup is an example of a good first meal.  Heavy or fried foods are harder to digest and may make you feel nauseous or bloated.  Likewise meals heavy in dairy and vegetables can cause extra gas to form and this can also increase the bloating.  Drink plenty of fluids but you should avoid alcoholic beverages for 24 hours.  ACTIVITY: Your care partner should take you home directly after the procedure.  You should plan to take it easy, moving slowly for the rest of the day.  You can resume normal activity the day after the procedure however you should NOT DRIVE or use heavy machinery for 24 hours (because of the sedation medicines used during the test).    SYMPTOMS TO REPORT IMMEDIATELY: A gastroenterologist can be reached at any hour.  During normal business hours, 8:30 AM to 5:00 PM Monday through Friday,  call [phone].  After hours and on weekends, please call the GI answering service at [phone] emergency number  who will take a message and have the physician on call contact you.   Following lower endoscopy (colonoscopy or flexible sigmoidoscopy):  Excessive amounts of blood in the stool  Significant tenderness or worsening of abdominal pains  Swelling of the abdomen that is new, acute  Fever of 100F or higher  FOLLOW UP: If any biopsies were taken you will be contacted by phone or by letter within the next 1-3 weeks.  Call your gastroenterologist if you have not heard about the biopsies in 3 weeks.  Our staff will call the home number listed on your records the next business day following your procedure to check on you and address any questions or concerns that you may have at that time regarding the information given to you following your procedure. This is a courtesy call and so if there is no answer at the home number and we have not heard from you through the emergency physician on call, we will assume that you have returned to your regular daily activities without incident.  SIGNATURES/CONFIDENTIALITY: You and/or your care partner have signed paperwork which will be entered into your electronic medical record.  These signatures attest to the fact that that the information above on your After Visit Summary has been reviewed and is understood.  Full responsibility of the   confidentiality of this discharge information lies with you and/or your care-partner.  Handouts on polyps diverticulosis and high fiber diet and hemorhoids

## 2012-04-11 NOTE — Op Note (Signed)
[facility] [address] [place], [address]   COLONOSCOPY PROCEDURE REPORT  PATIENT: David Carrillo, David Carrillo  MR#: [id number] BIRTHDATE: 10/20/1960 , 51  yrs. old GENDER: Male ENDOSCOPIST: Beverley Fiedler, MD REFERRED [id number], Gaynell Face PROCEDURE DATE:  04/11/2012 PROCEDURE:   Colonoscopy with snare polypectomy and Colonoscopy with cold biopsy polypectomy ASA CLASS:   Class I INDICATIONS:average risk screening and first colonoscopy. MEDICATIONS: MAC sedation, administered by CRNA and propofol (Diprivan) 300mg  IV  DESCRIPTION OF PROCEDURE:   After the risks benefits and alternatives of the procedure were thoroughly explained, informed consent was obtained.  A digital rectal exam revealed no rectal mass.   The LB [id number]  endoscope was introduced through the anus and advanced to the cecum, which was identified by both the appendix and ileocecal valve. No adverse events experienced. The quality of the prep was good, using MoviPrep  The instrument was then slowly withdrawn as the colon was fully examined.  COLON FINDINGS: Three sessile polyps ranging between 3-64mm in size were found in the sigmoid colon and rectosigmoid colon. Polypectomy was performed with cold forceps (1) and using cold snare (23).  All resections were complete and all polyp tissue was completely retrieved.   Mild diverticulosis was noted in the sigmoid colon.   One diverticula  in the sigmoid had mild surround erythema without purulent discharge.  Retroflexed views revealed internal hemorrhoids. The time to cecum=1 minutes 38 seconds. Withdrawal time=10 minutes 26 seconds.  The scope was withdrawn and the procedure completed. COMPLICATIONS: There were no complications.  ENDOSCOPIC IMPRESSION: 1.   Three sessile polyps ranging between 3-26mm in size were found in the sigmoid colon and rectosigmoid colon; Polypectomy was performed with cold forceps and using cold snare 2.   Mild  diverticulosis was noted in the sigmoid colon 3.   Small internal hemorrhoids  RECOMMENDATIONS: 1.  Await pathology results 2.  High fiber diet 3.  Timing of repeat colonoscopy will be determined by pathology findings. 4.  You will receive a letter within 1-2 weeks with the results of your biopsy as well as final recommendations.  Please call my office if you have not received a letter after 3 weeks.   eSigned:  Beverley Fiedler, MD 04/11/2012 8:59 AM  cc: The Patient

## 2012-04-12 ENCOUNTER — Telehealth: Payer: Self-pay | Admitting: *Deleted

## 2012-04-12 NOTE — Telephone Encounter (Signed)
  Follow up Call-  Call back number 04/11/2012  Post procedure Call Back phone  # [phone]  Permission to leave phone message Yes     Patient questions:  Do you have a fever, pain , or abdominal swelling? no Pain Score  0 *  Have you tolerated food without any problems? yes  Have you been able to return to your normal activities? yes  Do you have any questions about your discharge instructions: Diet   no Medications  no Follow up visit  no  Do you have questions or concerns about your Care? no  Actions: * If pain score is 4 or above: No action needed, pain <4.

## 2012-04-19 ENCOUNTER — Encounter: Payer: Self-pay | Admitting: Internal Medicine

## 2012-05-01 ENCOUNTER — Encounter: Payer: Self-pay | Admitting: Family Medicine

## 2012-05-01 ENCOUNTER — Ambulatory Visit (INDEPENDENT_AMBULATORY_CARE_PROVIDER_SITE_OTHER): Payer: 59 | Admitting: Family Medicine

## 2012-05-01 VITALS — BP 129/92 | HR 97 | Temp 98.6°F | Ht 70.0 in | Wt 190.0 lb

## 2012-05-01 DIAGNOSIS — R05 Cough: Secondary | ICD-10-CM | POA: Insufficient documentation

## 2012-05-01 DIAGNOSIS — I10 Essential (primary) hypertension: Secondary | ICD-10-CM

## 2012-05-01 DIAGNOSIS — R053 Chronic cough: Secondary | ICD-10-CM | POA: Insufficient documentation

## 2012-05-01 DIAGNOSIS — R059 Cough, unspecified: Secondary | ICD-10-CM

## 2012-05-01 LAB — BASIC METABOLIC PANEL
BUN: 13 mg/dL (ref 6–23)
Chloride: 104 mEq/L (ref 96–112)
Potassium: 4 mEq/L (ref 3.5–5.3)
Sodium: 139 mEq/L (ref 135–145)

## 2012-05-01 MED ORDER — CETIRIZINE HCL 10 MG PO TABS: 10.0000 mg | ORAL_TABLET | Freq: Every day | ORAL | Status: DC

## 2012-05-01 MED ORDER — BENZONATATE 100 MG PO CAPS: 100.0000 mg | ORAL_CAPSULE | Freq: Two times a day (BID) | ORAL | Status: DC | PRN

## 2012-05-01 NOTE — Progress Notes (Signed)
  Subjective:    Patient ID: David Carrillo, male    DOB: Apr 03, 1960, 52 y.o.   MRN: [id number]  HPI  Patient presents to same day appointment for cough.  Cough occurs constantly throughout the day.  Cough is mostly dry with occasional clear sputum.  This has been going on for about one month.  Associated symptoms: "sinus pressure", itchy eyes, fatigue, decreased appetite in the last 2 days.=.  Denies any nausea/vomiting or fever.  He has had cough during the spring season, but not like this.  Former smoker, quit 10 years ago.  Denies bloody sputum.  He has tried OTC cough suppressants without relief.  Denies any indigestion or reflux.  Review of Systems Per HPI    Objective:   Physical Exam  Vitals reviewed. Constitutional: He appears well-nourished. No distress.  HENT:  Head: Normocephalic and atraumatic.  Nose: Nose normal.  Mouth/Throat: No oropharyngeal exudate.  Oropharynx erythematous  Neck: Normal range of motion. Neck supple.  Cardiovascular: Normal rate and normal heart sounds.   Pulmonary/Chest: Effort normal and breath sounds normal. He has no wheezes. He has no rales.  Lymphadenopathy:    He has no cervical adenopathy.      Assessment & Plan:

## 2012-05-01 NOTE — Assessment & Plan Note (Addendum)
Cough likely secondary to seasonal allergies worsened by viral URI.  Will treat symptoms with Zyrtec and Tessalon perles.  Patient denies any hx of indigestion or reflux after eating, but this does not rule out GERD as etiology of cough.  If symptoms do not improve in 2 weeks on new regimen, patient to return to clinic.

## 2012-05-05 ENCOUNTER — Encounter: Payer: Self-pay | Admitting: Family Medicine

## 2012-05-15 ENCOUNTER — Telehealth: Payer: Self-pay | Admitting: Family Medicine

## 2012-05-15 NOTE — Telephone Encounter (Signed)
Wife is calling because David Carrillo is out of his cough medication and he is still coughing like crazy.  He also needs more refills on his Lisinopril sent in to [facility] on [address].  Wife would like a call with what Dr. Deirdre Priest would like to do for his cough.

## 2012-05-16 NOTE — Telephone Encounter (Signed)
Called spoke with wife.  Asked to stop lisinopril and monitor cough and blood pressure for next 2 weeks and to call us with a report She agrees

## 2012-05-31 ENCOUNTER — Ambulatory Visit (HOSPITAL_COMMUNITY)
Admission: RE | Admit: 2012-05-31 | Discharge: 2012-05-31 | Disposition: A | Discharge status: Home / Self Care | Payer: 59 | Source: Ambulatory Visit | Attending: Family Medicine | Admitting: Family Medicine

## 2012-05-31 ENCOUNTER — Ambulatory Visit (INDEPENDENT_AMBULATORY_CARE_PROVIDER_SITE_OTHER): Payer: 59 | Admitting: Family Medicine

## 2012-05-31 VITALS — BP 132/81 | HR 92 | Ht 70.0 in | Wt 192.0 lb

## 2012-05-31 DIAGNOSIS — K219 Gastro-esophageal reflux disease without esophagitis: Secondary | ICD-10-CM

## 2012-05-31 DIAGNOSIS — R059 Cough, unspecified: Secondary | ICD-10-CM | POA: Insufficient documentation

## 2012-05-31 DIAGNOSIS — R05 Cough: Secondary | ICD-10-CM

## 2012-05-31 DIAGNOSIS — F17200 Nicotine dependence, unspecified, uncomplicated: Secondary | ICD-10-CM | POA: Insufficient documentation

## 2012-05-31 MED ORDER — OMEPRAZOLE 20 MG PO CPDR: 20.0000 mg | DELAYED_RELEASE_CAPSULE | Freq: Every day | ORAL | Status: DC

## 2012-05-31 NOTE — Patient Instructions (Addendum)
1. Continue off of the lisinopril for now (although I do not think that is the cause at this point) 2. Start taking the reflux medicine 3. Get the CXR (I will call if it needs follow up and send letter if normal).   See me or Dr. Deirdre Priest back in 4 weeks as the omeprazole may take some time to have an effect. We will consider testing for COPD at that time and further investigation.  Dr. Durene Cal

## 2012-06-01 ENCOUNTER — Encounter: Payer: Self-pay | Admitting: Family Medicine

## 2012-06-01 NOTE — Assessment & Plan Note (Signed)
No improvement with Zyrtec, tessalon pearls, or OTC remedies. CXR without walking PNA, obvious mass/tumor. Persistent x 2 months now. Ace-i removed x 2 weeks without improvement. Other possible etiologies high on differential would be GERD, COPD, asthma. Will trial PPI x 4 weeks and if no improvement patient to return. Would consider PFTs at that time. Could also consider CT chest due to history of smoking but will defer until other etiologies ruled out. Continue off ace-i for now.

## 2012-06-01 NOTE — Progress Notes (Signed)
Subjective:   # Chronic Cough Cough x 2 months (March). Originally seen on 05/01/12 after 1 month of cough thought likely due to seasonal allergies worsened by URI. Treatment with Zyrtec and Tessslon with minimal improvement. Called back 2 weeks later on 5/5 with continued cough and no improvement and advised by Dr. Deirdre Priest to stop Ace-I and follow up. Patient did say that dry eyes and sneezing improved on zyrtec just not the cough.   Patient follows up today for persistent cough. Patient denies fever/nausea/vomiting/unintentional weight loss/fatigue. Describes cough as either productive with clear or white sputum or nonproductive. No hemoptysis. No imaging. Does endorse history of GERD with occasional use of Tums but states cough is not related to meals.   Briefly, patient also describes that his left elbow became swollen several weeks ago and that it is improving. Was red and warm and seemed to be puffy. This has almost completely resolved.   ROS--See HPI  Past Medical History-25 pack year smoking history, hypertension, GERD Reviewed problem list.  Medications- reviewed and updated Chief complaint-noted  Objective: BP 132/81  Pulse 92  Ht 5\' 10"  (1.778 m)  Wt 192 lb (87.091 kg)  BMI 27.55 kg/m2 Gen: NAD, resting comfortably on table except for recurrent bouts of coughing CV: RRR no murmurs rubs or gallops Lungs: CTAB no crackles, wheeze, rhonchi Abd: soft/nontender/nondistended/normal bowel sounds Skin: warm, dry Neuro: grossly normal, moves all extremities MSK: Right elbow slightly warm and with slight fluid  CXR-no acute cardiopulmonary disease  Assessment/Plan:  Elbow swelling-likely olecranon bursitis, continues to improve.

## 2012-06-26 ENCOUNTER — Other Ambulatory Visit: Payer: Self-pay | Admitting: Family Medicine

## 2012-07-03 ENCOUNTER — Ambulatory Visit (INDEPENDENT_AMBULATORY_CARE_PROVIDER_SITE_OTHER): Payer: 59 | Admitting: Family Medicine

## 2012-07-03 ENCOUNTER — Encounter: Payer: Self-pay | Admitting: Family Medicine

## 2012-07-03 ENCOUNTER — Encounter (INDEPENDENT_AMBULATORY_CARE_PROVIDER_SITE_OTHER): Payer: 59 | Admitting: Family Medicine

## 2012-07-03 DIAGNOSIS — R05 Cough: Secondary | ICD-10-CM

## 2012-07-03 DIAGNOSIS — I10 Essential (primary) hypertension: Secondary | ICD-10-CM

## 2012-07-03 NOTE — Assessment & Plan Note (Signed)
Well controlled of medications.  Last labs normal.  Will observe off medications

## 2012-07-03 NOTE — Patient Instructions (Addendum)
Your blood pressure is fine keep checking it If regularly > 140/90 then call  If your cough is not better nearly gone in 1 month if if any worse or any new symptoms then call and we will order the tests

## 2012-07-03 NOTE — Assessment & Plan Note (Signed)
Some improvement over time .  No red flags.  Will follow if does not resolve or worsens needs PFTs and possible CT chest

## 2012-07-03 NOTE — Progress Notes (Signed)
  Subjective:    Patient ID: Messi Twedt, male    DOB: February 01, 1960, 52 y.o.   MRN: [id number]  HPI  Cough -is somewhat better the last 1-2 weeks. No change with PPI or being off acei.  No fever or hemoptysis or weight loss or sputum or change in shortness of breath or edema.  Was former smoker.  HYPERTENSION Disease Monitoring Home BP Monitoring < 140/90 Chest pain- no    Dyspnea- no Medications Compliance-  Did not restart lisinopril. Lightheadedness-  no  Edema- no ROS - See HPI  PMH Lab Review   Potassium  Date Value Range Status  05/01/2012 4.0  3.5 - 5.3 mEq/L Final     Sodium  Date Value Range Status  05/01/2012 139  135 - 145 mEq/L Final     Creat  Date Value Range Status  05/01/2012 0.94  0.50 - 1.35 mg/dL Final     Creatinine, Ser  Date Value Range Status  06/02/2009 0.87  0.40-1.50 mg/dL Final        Review of Symptoms - see HPI  PMH - Smoking status noted.      Review of Systems     Objective:   Physical Exam Alert no acute distress Heart - Regular rate and rhythm.  No murmurs, gallops or rubs.    Marland Kitchenlungs Extremities:  No cyanosis, edema, or deformity noted with good range of motion of all major joints.   Neck:  No deformities, thyromegaly, masses, or tenderness noted.   Supple with full range of motion without pain.        Assessment & Plan:

## 2012-07-04 NOTE — Progress Notes (Signed)
This encounter was created in error - please disregard.

## 2012-07-19 ENCOUNTER — Ambulatory Visit (INDEPENDENT_AMBULATORY_CARE_PROVIDER_SITE_OTHER): Payer: 59 | Admitting: Family Medicine

## 2012-07-19 ENCOUNTER — Encounter: Payer: Self-pay | Admitting: Family Medicine

## 2012-07-19 VITALS — BP 118/79 | HR 82 | Temp 97.9°F | Ht 70.0 in | Wt 196.0 lb

## 2012-07-19 DIAGNOSIS — R059 Cough, unspecified: Secondary | ICD-10-CM

## 2012-07-19 DIAGNOSIS — T169XXA Foreign body in ear, unspecified ear, initial encounter: Secondary | ICD-10-CM

## 2012-07-19 DIAGNOSIS — R05 Cough: Secondary | ICD-10-CM

## 2012-07-19 DIAGNOSIS — T162XXA Foreign body in left ear, initial encounter: Secondary | ICD-10-CM

## 2012-07-19 DIAGNOSIS — I10 Essential (primary) hypertension: Secondary | ICD-10-CM

## 2012-07-19 MED ORDER — NEOMYCIN-POLYMYXIN-HC 3.5-10000-1 OT SOLN: 3.0000 [drp] | Freq: Four times a day (QID) | OTIC | Status: DC

## 2012-07-19 NOTE — Assessment & Plan Note (Signed)
Improved off acei continue to monitor

## 2012-07-19 NOTE — Progress Notes (Signed)
Subjective:     Patient ID: David Carrillo, male   DOB: 1960-11-21, 52 y.o.   MRN: [id number]  HPI  Foreign Body David Carrillo is a 52 yo man who comes to the clinic today because he believes he may have a Q-tip cotton end stuck in his left ear. He states that it has been 1 wk since he was cleaning his ears and found that when he removed the Q-tip there was no cotton on the end. Over the week he says his ear has not bothered him, although in the last few days he has experienced some soreness and possibly a slight change in hearing of the left ear. Previously, David Carrillo tried washing the cotton out in the shower but had no success in doing so.  Cough  Additionally, David Carrillo was seen previously for persistent dry cough, for which his medication lisinopril was discontinued since he had good BP control (<140/90) and his cough may have been the result of this medication. He currently states that his cough has improved since his last visit.   Review of Systems Pt denies fevers, headaches, SOB, and chest pain.    Objective:   Physical Exam General - Sitting upright in chair in no apparent distress. Pt speaks in full sentences and responds appropriately to questions. A&Ox3. Ear - Cotton lodged in left ear canal. Upon clearing foreign object, left TM appeared red and dull canal was irritated and red .  No dc . Right ear TM clear and bony landmarks present  Problem List: 1. Foreign object in left ear 2. Persistent dry cough 3. Irritated left TM   Procedure Using operating otoscope head and disposable speculum and alligator forceps - foreign body removed.  Patient tolerated well    Assessment:     This is 52 yo man with a foreign object lodged in his left ear and persistent cough that has improved upon discontinuation of Lisinopril.     Plan:     1. Foreign object and irritated TM (left ear) - Object was removed and pt given Corticosporin otic for infxn prophylaxis. 2. Persistent cough - pt  will stay off of lisinopril so long as his BP stays under control (<140/90). If medication is necessary in the future, an ARB (eg. Losartan) will be considered.      Reviewed above note and approve

## 2012-07-19 NOTE — Assessment & Plan Note (Signed)
Controlled off medications monitor 

## 2012-07-19 NOTE — Patient Instructions (Addendum)
Use the ear drops for 3-4 days.  If any increased pain or discharge then call us  Keep your blood pressure < 140/90  Have a great summer

## 2012-09-18 ENCOUNTER — Encounter: Payer: Self-pay | Admitting: Family Medicine

## 2012-09-18 ENCOUNTER — Ambulatory Visit (INDEPENDENT_AMBULATORY_CARE_PROVIDER_SITE_OTHER): Payer: 59 | Admitting: Family Medicine

## 2012-09-18 ENCOUNTER — Ambulatory Visit (HOSPITAL_COMMUNITY)
Admission: RE | Admit: 2012-09-18 | Discharge: 2012-09-18 | Disposition: A | Discharge status: Home / Self Care | Payer: 59 | Source: Ambulatory Visit | Attending: Family Medicine | Admitting: Family Medicine

## 2012-09-18 VITALS — BP 134/78 | HR 87 | Temp 98.1°F | Wt 196.0 lb

## 2012-09-18 DIAGNOSIS — R0602 Shortness of breath: Secondary | ICD-10-CM | POA: Insufficient documentation

## 2012-09-18 DIAGNOSIS — R079 Chest pain, unspecified: Secondary | ICD-10-CM

## 2012-09-18 DIAGNOSIS — R0609 Other forms of dyspnea: Secondary | ICD-10-CM

## 2012-09-18 MED ORDER — ATORVASTATIN CALCIUM 40 MG PO TABS: 40.0000 mg | ORAL_TABLET | Freq: Every day | ORAL | Status: DC

## 2012-09-18 MED ORDER — NITROGLYCERIN 0.4 MG SL SUBL: 0.4000 mg | SUBLINGUAL_TABLET | SUBLINGUAL | Status: DC | PRN

## 2012-09-18 NOTE — Assessment & Plan Note (Addendum)
Given dyspnea on exertion and chest pain (with history HTN, possible HLD, and family history of MI in father before 62), decision made to refer to cardiology with concern of stable angina. Suspect patient will need NM stress test. Suspect previously seen by [facility] but unable to tell by chart or patient.  EKG with nonspecific t wave flattening in v1.  Check BNP (no obvious fluid overload) and cbc (for anemia) Check lipids and start on statin until seen by cardiology Nitroglycerin prn for chest pain or shortness of breath Follow up after cardiology or prn if symptoms worsen or do not improve.  Warning signs given for need to go to ED

## 2012-09-18 NOTE — Progress Notes (Signed)
[facility] Tana Conch, MD Phone: [phone]  Subjective:  Chief complaint-noted  # Shortness of Breath and Chest pain/tightness Patient states has had gradually progressive dyspnea over 3 months. Had been seen about 2 months ago and states symptoms were only mild at that time. Has progressed to the point where he gets short of breath just going up a flight of steps or holding his granddaughter for any period of time and this is atypical for him. No nausea/vomiting. Slight diaphoresis. When he mows his lawn, states feels a central tightness in his chest after being SOB for several minutes. Symptoms (tightness of shortness of breath) always relieved with rest within 10 minutes.   ROS--otherwise See HPI. Specifically no melena/hematochezia/hemoptysis/LE edema/orthopnea/PND. Past Medical History-hypertension. Unknown history hyperlipidemia. No known DM. Reports stress test in 2007 after history of syncope but has not had this since and reports normal stress test (does not know location though) Past medical history-quit smoking 10 years ago Family history-father with MI at age 62  Reviewed problem list.  Medications- reviewed and updated Current Outpatient Prescriptions on File Prior to Visit  Medication Sig Dispense Refill  . aspirin 81 MG tablet Take 81 mg by mouth daily.       Objective: BP 134/78  Pulse 87  Temp(Src) 98.1 F (36.7 C) (Oral)  Wt 196 lb (88.905 kg)  BMI 28.12 kg/m2 Gen: NAD, resting comfortably in chair Neck: No JVD HEENT: Mucous membranes are moist. CV: RRR no murmurs rubs or gallops Lungs: CTAB no crackles, wheeze, rhonchi Abd: soft/nontender-no epigastric pain Skin: warm, dry Neuro: grossly normal, moves all extremities Ext: no edema  EKG REPORT   Date: 09/18/2012  EKG Time: 5:36 PM  Rate: 79   Rhythm: normal sinus rhythm,  normal EKG, normal sinus rhythm, there are no previous tracings available for comparison  Axis: normal  axis  Intervals:normal pr, qrs, qt interval  ST&T Change: slight flattening in v1   Narrative Interpretation: nonspecific st-t wave changes, no STEMI             Assessment/Plan:

## 2012-09-18 NOTE — Patient Instructions (Addendum)
For your shortness of breath   1. We are going to refer you to cardiology. Come see Shanda Bumps in the morning when you come for lab so she can give you your appointment time.   2. I want to get some labwork but our lab is closed today. Please come in the morning to get this blood work  3. For your choelsterol-I want you to take the cholesterol lowering medicine definitely until you see cardiology  4. Rest after you have shortness of breath, if not better in 10 minutes, take nitroglycerin. Do the same for the chest tightness  Follow up with [place] within a week or so from seeing cardiology or sooner as needed,  Dr. Durene Cal  We can do these next time: Health Maintenance Due  Topic Date Due  . Influenza Vaccine  08/11/2012

## 2012-09-19 ENCOUNTER — Other Ambulatory Visit: Payer: 59

## 2012-09-19 DIAGNOSIS — R0602 Shortness of breath: Secondary | ICD-10-CM

## 2012-09-19 DIAGNOSIS — R079 Chest pain, unspecified: Secondary | ICD-10-CM

## 2012-09-19 LAB — LIPID PANEL
LDL Cholesterol: 108 mg/dL — ABNORMAL HIGH (ref 0–99)
Total CHOL/HDL Ratio: 3.8 Ratio
Triglycerides: 112 mg/dL (ref <150)
VLDL: 22 mg/dL (ref 0–40)

## 2012-09-19 LAB — CBC
HCT: 44.8 % (ref 39.0–52.0)
Hemoglobin: 16 g/dL (ref 13.0–17.0)
MCH: 33.1 pg (ref 26.0–34.0)
MCHC: 35.7 g/dL (ref 30.0–36.0)

## 2012-09-19 NOTE — Progress Notes (Signed)
CBC,FLP AND PRO BNP DONE TODAY David Carrillo

## 2012-09-19 NOTE — Addendum Note (Signed)
Addended by: Jone Baseman D on: 09/19/2012 09:44 AM   Modules accepted: Orders

## 2012-09-20 ENCOUNTER — Encounter: Payer: Self-pay | Admitting: Cardiology

## 2012-09-20 ENCOUNTER — Ambulatory Visit (INDEPENDENT_AMBULATORY_CARE_PROVIDER_SITE_OTHER): Payer: 59 | Admitting: Cardiology

## 2012-09-20 VITALS — BP 134/95 | HR 85 | Ht 70.0 in | Wt 192.8 lb

## 2012-09-20 DIAGNOSIS — R079 Chest pain, unspecified: Secondary | ICD-10-CM

## 2012-09-20 LAB — PRO B NATRIURETIC PEPTIDE: Pro B Natriuretic peptide (BNP): 23.24 pg/mL (ref <126)

## 2012-09-20 NOTE — Progress Notes (Signed)
Patient ID: David Carrillo, male   DOB: [birth year], 52 y.o.   MRN: [id number]    Patient Name: David Carrillo Date of Encounter: 09/20/2012  Primary Care Provider:  Carney Living, MD Primary Cardiologist:  Tobias Alexander, MD  Patient Profile 52 year old male with progressively worsening exertional chest pain  Problem List   Past Medical History  Diagnosis Date  . Hypertension     borderline-no meds  . Neuromuscular disorder     some numbness rt hand-7/13  . GERD (gastroesophageal reflux disease)    Past Surgical History  Procedure Laterality Date  . Shoulder arthroscopy  3 years ago    Allergies  No Known Allergies  HPI  52 year old male, prior smoker, with h/o hypertension and family h/o CAD who has noticed worsening shortness of breath during exercise. It started about a year ago and usually appears after working in his yard or walking a few flight of stairs. This is sometimes associated with mid sternal chest tightness that resolves in few minutes after he stops the activity. He has never tried NTG. He also describes occasional palpitations that last about 5 minutes and are sometimes associated with dizziness. No syncope. He visited his PCP yesterday who started him on aspirin and lipitor.   Home Medications  Prior to Admission medications   Medication Sig Start Date End Date Taking? Authorizing Provider  aspirin 81 MG tablet Take 81 mg by mouth daily.   Yes Historical Provider, MD  atorvastatin (LIPITOR) 40 MG tablet Take 1 tablet (40 mg total) by mouth daily. 09/18/12  Yes Shelva Majestic, MD  neomycin-polymyxin-hydrocortisone (CORTISPORIN) otic solution Place 3 drops into the left ear 4 (four) times daily. 07/19/12  Yes Carney Living, MD  nitroGLYCERIN (NITROSTAT) 0.4 MG SL tablet Place 1 tablet (0.4 mg total) under the tongue every 5 (five) minutes as needed for chest pain (if no relief after 3, call 911). 09/18/12  Yes Shelva Majestic, MD    Family  History  Family History  Problem Relation Age of Onset  . Colon cancer Neg Hx     Social History  History   Social History  . Marital Status: Married    Spouse Name: N/A    Number of Children: N/A  . Years of Education: N/A   Occupational History  . Not on file.   Social History Main Topics  . Smoking status: Former Smoker    Quit date: 08/05/2002  . Smokeless tobacco: Not on file  . Alcohol Use: No  . Drug Use: No  . Sexual Activity: Not on file   Other Topics Concern  . Not on file   Social History Narrative  . No narrative on file     Review of Systems General:  No chills, fever, night sweats or weight changes.  Cardiovascular:  No chest pain, dyspnea on exertion, edema, orthopnea, palpitations, paroxysmal nocturnal dyspnea. Dermatological: No rash, lesions/masses Respiratory: No cough, dyspnea Urologic: No hematuria, dysuria Abdominal:   No nausea, vomiting, diarrhea, bright red blood per rectum, melena, or hematemesis Neurologic:  No visual changes, wkns, changes in mental status. All other systems reviewed and are otherwise negative except as noted above.  Physical Exam  Blood pressure 134/95, pulse 85, height 5\' 10"  (1.778 m), weight 192 lb 12.8 oz (87.454 kg), SpO2 97.00%.  General: Pleasant, NAD Psych: Normal affect. Neuro: Alert and oriented X 3. Moves all extremities spontaneously. HEENT: Normal  Neck: Supple without bruits or JVD. Lungs:  Resp  regular and unlabored, CTA. Heart: RRR no s3, s4, or murmurs. Abdomen: Soft, non-tender, non-distended, BS + x 4.  Extremities: No clubbing, cyanosis or edema. DP/PT/Radials 2+ and equal bilaterally.  Accessory Clinical Findings  ECG - NSR, normal PR, QRS, QT intervals, no ST-T wave abnormalities  Assessment & Plan  52 year old male with risk factors for CAD including former 20 years of smoking, FH of CAD, HTN and hyperlipidemia with typical anginal symptoms.  - continue ASA 81 mg daily - continue  atorvastatin 40 mg QHS - order exercise nuclear stress test ASAP - s.l. NTG PRN - follow up in 2 weeks  Tobias Alexander, Rexene Edison, MD  09/20/2012, 9:03 AM

## 2012-09-20 NOTE — Patient Instructions (Addendum)
Your physician has requested that you have en exercise stress myoview. For further information please visit [email or web address] Please follow instruction sheet, as given.  Your physician recommends that you schedule a follow-up appointment in: 2 weeks with Dr.Nelson

## 2012-09-22 ENCOUNTER — Encounter: Payer: Self-pay | Admitting: Family Medicine

## 2012-09-28 ENCOUNTER — Ambulatory Visit (HOSPITAL_COMMUNITY): Payer: 59 | Attending: Cardiology | Admitting: Radiology

## 2012-09-28 VITALS — BP 132/96 | HR 78 | Ht 70.0 in | Wt 191.0 lb

## 2012-09-28 DIAGNOSIS — R0789 Other chest pain: Secondary | ICD-10-CM | POA: Insufficient documentation

## 2012-09-28 DIAGNOSIS — R0602 Shortness of breath: Secondary | ICD-10-CM

## 2012-09-28 DIAGNOSIS — R0609 Other forms of dyspnea: Secondary | ICD-10-CM | POA: Insufficient documentation

## 2012-09-28 DIAGNOSIS — R42 Dizziness and giddiness: Secondary | ICD-10-CM | POA: Insufficient documentation

## 2012-09-28 DIAGNOSIS — E785 Hyperlipidemia, unspecified: Secondary | ICD-10-CM | POA: Insufficient documentation

## 2012-09-28 DIAGNOSIS — R0989 Other specified symptoms and signs involving the circulatory and respiratory systems: Secondary | ICD-10-CM | POA: Insufficient documentation

## 2012-09-28 DIAGNOSIS — R002 Palpitations: Secondary | ICD-10-CM | POA: Insufficient documentation

## 2012-09-28 DIAGNOSIS — R61 Generalized hyperhidrosis: Secondary | ICD-10-CM | POA: Insufficient documentation

## 2012-09-28 DIAGNOSIS — R079 Chest pain, unspecified: Secondary | ICD-10-CM

## 2012-09-28 DIAGNOSIS — I10 Essential (primary) hypertension: Secondary | ICD-10-CM | POA: Insufficient documentation

## 2012-09-28 DIAGNOSIS — R000 Tachycardia, unspecified: Secondary | ICD-10-CM | POA: Insufficient documentation

## 2012-09-28 DIAGNOSIS — Z8249 Family history of ischemic heart disease and other diseases of the circulatory system: Secondary | ICD-10-CM | POA: Insufficient documentation

## 2012-09-28 DIAGNOSIS — Z87891 Personal history of nicotine dependence: Secondary | ICD-10-CM | POA: Insufficient documentation

## 2012-09-28 MED ORDER — TECHNETIUM TC 99M SESTAMIBI GENERIC - CARDIOLITE
10.5000 | Freq: Once | INTRAVENOUS | Status: AC | PRN
  Administered 2012-09-28: 11 via INTRAVENOUS

## 2012-09-28 MED ORDER — TECHNETIUM TC 99M SESTAMIBI GENERIC - CARDIOLITE
33.0000 | Freq: Once | INTRAVENOUS | Status: AC | PRN
  Administered 2012-09-28: 33 via INTRAVENOUS

## 2012-09-28 NOTE — Progress Notes (Signed)
[facility] SITE 3 NUCLEAR MED [address] [place], [place] [address] [phone]    Cardiology Nuclear Med Study  David Carrillo is a 52 y.o. male     MRN : [id number]     DOB: Jul 06, 1960  Procedure Date: 09/28/2012  Nuclear Med Background Indication for Stress Test:  Evaluation for Ischemia History:  '07 [id number] per patient Cardiac Risk Factors: Family History - CAD, History of Smoking, Hypertension and Lipids  Symptoms:  Chest Tightness with Exertion (last episode of chest discomfort was about a week ago), Diaphoresis, Dizziness, DOE, Light-Headedness, Palpitations and Rapid HR   Nuclear Pre-Procedure Caffeine/Decaff Intake:  None NPO After: 9:30pm   Lungs:  Clear. O2 Sat: 97% on room air. IV 0.9% NS with Angio Cath:  20g  IV Site: R Hand  IV Started by:  Cathlyn Parsons, RN  Chest Size (in):  44 Cup Size: n/a  Height: 5\' 10"  (1.778 m)  Weight:  191 lb (86.637 kg)  BMI:  Body mass index is 27.41 kg/(m^2). Tech Comments:  n/a    Nuclear Med Study 1 or 2 day study: 1 day  Stress Test Type:  Stress  Reading MD: Wyvonna Plum  Order Authorizing Provider:  Tobias Alexander, MD  Resting Radionuclide: Technetium 22m Sestamibi  Resting Radionuclide Dose: 10.5 mCi   Stress Radionuclide:  Technetium 8m Sestamibi  Stress Radionuclide Dose: 33.0 mCi           Stress Protocol Rest HR: 78 Stress HR: 164  Rest BP: 132/96 Stress BP: 171/96  Exercise Time (min): 8:00 METS: 10.1   Predicted Max HR: 166 bpm % Max HR: 98.8 bpm Rate Pressure Product: 46962   Dose of Adenosine (mg):  n/a Dose of Lexiscan: n/a mg  Dose of Atropine (mg): n/a Dose of Dobutamine: n/a mcg/kg/min (at max HR)  Stress Test Technologist: Smiley Houseman, CMA-N  Nuclear Technologist:  Domenic Polite, CNMT     Rest Procedure:  Myocardial perfusion imaging was performed at rest 45 minutes following the intravenous administration of Technetium 33m Sestamibi.  Rest ECG: NSR - Normal  EKG  Stress Procedure:  The patient exercised on the treadmill utilizing the Bruce Protocol for eight minutes. The patient stopped due to dyspnea and denied any chest pain. Occasional PAC's were noted.  Technetium 72m Sestamibi was injected at peak exercise and myocardial perfusion imaging was performed after a brief delay.  Stress ECG: No significant change from baseline ECG  QPS Raw Data Images:  Normal; no motion artifact; normal heart/lung ratio. Stress Images:  Normal homogeneous uptake in all areas of the myocardium. Rest Images:  Normal homogeneous uptake in all areas of the myocardium. Subtraction (SDS):  No evidence of ischemia. Transient Ischemic Dilatation (Normal <1.22):  n/a Lung/Heart Ratio (Normal <0.45):  0.43  Quantitative Gated Spect Images QGS EDV:  80 ml QGS ESV:  31 ml  Impression Exercise Capacity:  Good exercise capacity. BP Response:  Normal blood pressure response. Clinical Symptoms:  No significant symptoms noted. ECG Impression:  No significant ST segment change suggestive of ischemia. Comparison with Prior Nuclear Study: No previous nuclear study performed  Overall Impression:  Normal stress nuclear study.  LV Ejection Fraction: 62%.  LV Wall Motion:  NL LV Function; NL Wall Motion  Tobias Alexander, Rexene Edison 09/28/2012

## 2012-10-04 ENCOUNTER — Encounter: Payer: Self-pay | Admitting: Cardiology

## 2012-10-04 ENCOUNTER — Ambulatory Visit (INDEPENDENT_AMBULATORY_CARE_PROVIDER_SITE_OTHER): Payer: 59 | Admitting: Cardiology

## 2012-10-04 VITALS — BP 134/86 | HR 82 | Ht 70.0 in | Wt 195.0 lb

## 2012-10-04 DIAGNOSIS — R079 Chest pain, unspecified: Secondary | ICD-10-CM

## 2012-10-04 MED ORDER — METOPROLOL TARTRATE 25 MG PO TABS: 25.0000 mg | ORAL_TABLET | Freq: Two times a day (BID) | ORAL | Status: DC

## 2012-10-04 NOTE — Progress Notes (Signed)
Patient ID: David Carrillo, male   DOB: 11-09-1960, 52 y.o.   MRN: [id number]   Patient Name: David Carrillo Date of Encounter: 10/04/2012  Primary Care Provider:  Carney Living, MD Primary Cardiologist:  Tobias Alexander, MD  Patient Profile Chest pain  Problem List   Past Medical History  Diagnosis Date  . Hypertension     borderline-no meds  . Neuromuscular disorder     some numbness rt hand-7/13  . GERD (gastroesophageal reflux disease)    Past Surgical History  Procedure Laterality Date  . Shoulder arthroscopy  3 years ago    Allergies  No Known Allergies  HPI  52 year old male, prior smoker, with h/o hypertension and family h/o CAD who is coming for follow up for exertional chest pain. His stress nuclear stress test was negative for ischemia, and showed good LV function and exercise capacity. In the meantime the patient reports only SOB on exertion, no recent chest pains. He states that his BP fluctuates when he checks at home, at can be in 140-150 range. He is currently on no medication for BP as he developed cough after ACEI.   Home Medications  Prior to Admission medications   Medication Sig Start Date End Date Taking? Authorizing Provider  aspirin 81 MG tablet Take 81 mg by mouth daily.   Yes Historical Provider, MD  atorvastatin (LIPITOR) 40 MG tablet Take 1 tablet (40 mg total) by mouth daily. 09/18/12  Yes Shelva Majestic, MD  neomycin-polymyxin-hydrocortisone (CORTISPORIN) otic solution Place 3 drops into the left ear 4 (four) times daily. 07/19/12  Yes Carney Living, MD  nitroGLYCERIN (NITROSTAT) 0.4 MG SL tablet Place 1 tablet (0.4 mg total) under the tongue every 5 (five) minutes as needed for chest pain (if no relief after 3, call 911). 09/18/12  Yes Shelva Majestic, MD    Family History  Family History  Problem Relation Age of Onset  . Colon cancer Neg Hx     Social History  History   Social History  . Marital Status: Married   Spouse Name: N/A    Number of Children: N/A  . Years of Education: N/A   Occupational History  . Not on file.   Social History Main Topics  . Smoking status: Former Smoker    Quit date: 08/05/2002  . Smokeless tobacco: Not on file  . Alcohol Use: No  . Drug Use: No  . Sexual Activity: Not on file   Other Topics Concern  . Not on file   Social History Narrative  . No narrative on file     Review of Systems General:  No chills, fever, night sweats or weight changes.  Cardiovascular:  No chest pain, + dyspnea on exertion, no edema, orthopnea, palpitations, paroxysmal nocturnal dyspnea. Dermatological: No rash, lesions/masses Respiratory: No cough, dyspnea Urologic: No hematuria, dysuria Abdominal:   No nausea, vomiting, diarrhea, bright red blood per rectum, melena, or hematemesis Neurologic:  No visual changes, wkns, changes in mental status. All other systems reviewed and are otherwise negative except as noted above.  Physical Exam  Blood pressure 134/86, pulse 82, height 5\' 10"  (1.778 m), weight 195 lb (88.451 kg).  General: Pleasant, NAD Psych: Normal affect. Neuro: Alert and oriented X 3. Moves all extremities spontaneously. HEENT: Normal  Neck: Supple without bruits or JVD. Lungs:  Resp regular and unlabored, CTA. Heart: RRR no s3, s4, or murmurs. Abdomen: Soft, non-tender, non-distended, BS + x 4.  Extremities: No clubbing, cyanosis or  edema. DP/PT/Radials 2+ and equal bilaterally.  Accessory Clinical Findings  ECG - NSR, normal PR, QRS, QT intervals, no ST-T wave abnormalities  Stress MIBI SPECT 09/28/2012 Impression  Exercise Capacity: Good exercise capacity.  BP Response: Normal blood pressure response.  Clinical Symptoms: No significant symptoms noted.  ECG Impression: No significant ST segment change suggestive of ischemia.  Comparison with Prior Nuclear Study: No previous nuclear study performed  Overall Impression: Normal stress nuclear study.  LV  Ejection Fraction: 62%. LV Wall Motion: NL LV Function; NL Wall Motion    Assessment & Plan  52 year old male with risk factors for CAD including former 20 years of smoking, FH of CAD, HTN and hyperlipidemia with exertional chest pain.  1. Chest pain - normal nuclear stress test with good exercise capacity. - continue ASA 81 mg daily, atorvastatin 40 mg QHS  - the patient was counseled about healthy diet and a proper exercise  2. Hypertensioin - start Metoprolol 25 mg BID  3. Hyperlipidemia - Atorvastatin 40 mg QHS, exercise minimum 3 x weekly for 30 minutes   Follow up in 6 months  Tobias Alexander, Rexene Edison, MD  10/04/2012, 9:53 AM

## 2012-10-04 NOTE — Patient Instructions (Addendum)
Your physician wants you to follow-up in: 6 MONTHS WITH DR. Delton See. You will receive a reminder letter in the mail two months in advance. If you don't receive a letter, please call our office to schedule the follow-up appointment.  Your physician has recommended you make the following change in your medication:  1) START METOPROLOL TARTRATE 25 MG TWICE DAILY

## 2012-11-04 ENCOUNTER — Other Ambulatory Visit: Payer: Self-pay | Admitting: Family Medicine

## 2012-12-11 ENCOUNTER — Other Ambulatory Visit: Payer: Self-pay | Admitting: Family Medicine

## 2013-02-07 ENCOUNTER — Other Ambulatory Visit: Payer: Self-pay

## 2013-02-07 MED ORDER — METOPROLOL TARTRATE 25 MG PO TABS: 25.0000 mg | ORAL_TABLET | Freq: Two times a day (BID) | ORAL | Status: DC

## 2013-07-30 ENCOUNTER — Other Ambulatory Visit: Payer: Self-pay | Admitting: Family Medicine

## 2014-03-04 ENCOUNTER — Other Ambulatory Visit: Payer: Self-pay | Admitting: Family Medicine

## 2014-04-24 ENCOUNTER — Ambulatory Visit (INDEPENDENT_AMBULATORY_CARE_PROVIDER_SITE_OTHER): Payer: 59 | Admitting: Family Medicine

## 2014-04-24 ENCOUNTER — Encounter: Payer: Self-pay | Admitting: Family Medicine

## 2014-04-24 ENCOUNTER — Ambulatory Visit
Admission: RE | Admit: 2014-04-24 | Discharge: 2014-04-24 | Disposition: A | Discharge status: Home / Self Care | Payer: 59 | Source: Ambulatory Visit | Attending: Family Medicine | Admitting: Family Medicine

## 2014-04-24 VITALS — BP 136/96 | HR 76 | Temp 98.3°F | Ht 70.0 in | Wt 203.0 lb

## 2014-04-24 DIAGNOSIS — I10 Essential (primary) hypertension: Secondary | ICD-10-CM

## 2014-04-24 DIAGNOSIS — E785 Hyperlipidemia, unspecified: Secondary | ICD-10-CM

## 2014-04-24 DIAGNOSIS — M25562 Pain in left knee: Secondary | ICD-10-CM

## 2014-04-24 DIAGNOSIS — I2510 Atherosclerotic heart disease of native coronary artery without angina pectoris: Secondary | ICD-10-CM | POA: Insufficient documentation

## 2014-04-24 LAB — LIPID PANEL
Cholesterol: 111 mg/dL (ref 0–200)
HDL: 47 mg/dL (ref >=40)
LDL Cholesterol: 46 mg/dL (ref 0–99)
Total CHOL/HDL Ratio: 2.4 Ratio
Triglycerides: 89 mg/dL (ref <150)
VLDL: 18 mg/dL (ref 0–40)

## 2014-04-24 LAB — BASIC METABOLIC PANEL WITH GFR
BUN: 11 mg/dL (ref 6–23)
CO2: 26 mEq/L (ref 19–32)
CREATININE: 0.93 mg/dL (ref 0.50–1.35)
Calcium: 9.7 mg/dL (ref 8.4–10.5)
Chloride: 106 mEq/L (ref 96–112)
GFR, Est Non African American: >89 mL/min
Glucose, Bld: 98 mg/dL (ref 70–99)
POTASSIUM: 4.8 meq/L (ref 3.5–5.3)
Sodium: 142 mEq/L (ref 135–145)

## 2014-04-24 NOTE — Progress Notes (Signed)
   Subjective:    Patient ID: David Carrillo, male    DOB: 03-18-1960, 54 y.o.   MRN: [id number]  HPI  Left Knee Pain Injured many years ago with acute swelling.  Since has had intrmittent pain but has been worse over last few months.  No swelling or redness.  No locking.  Occsl mild giving out sensation.  Pain is generalized.  Has not taken any meds   HYPERTENSION Disease Monitoring Home BP Monitoring his cuff broke Chest pain- nn    Dyspnea- no Medications Compliance-  Daily metoprolol bid . Lightheadedness-  no  Edema- no ROS - See HPI  PMH Lab Review   POTASSIUM  Date Value Ref Range Status  05/01/2012 4.0 3.5 - 5.3 mEq/L Final   SODIUM  Date Value Ref Range Status  05/01/2012 139 135 - 145 mEq/L Final   CREAT  Date Value Ref Range Status  05/01/2012 0.94 0.50 - 1.35 mg/dL Final   CREATININE, SER  Date Value Ref Range Status  06/02/2009 0.87 0.40-1.50 mg/dL Final     Chief Complaint noted Review of Symptoms - see HPI PMH - Smoking status noted.   Vital Signs reviewed       Review of Systems     Objective:   Physical Exam  Alert nad Heart - Regular rate and rhythm.  No murmurs, gallops or rubs.    Lungs:  Normal respiratory effort, chest expands symmetrically. Lungs are clear to auscultation, no crackles or wheezes. Left Knee - FROM.  No effusion mild crepitus.  No laxity on testing.  No meniscus signs       Assessment & Plan:

## 2014-04-24 NOTE — Patient Instructions (Signed)
Good to see you today!  Thanks for coming in.  I will call you with the results  Get a bp cuff and check your bp several times a week and write down your bp readings  Try otc tylenol and aspercreme for the knee  Lowering your wt will decrease the knee pain

## 2014-04-24 NOTE — Assessment & Plan Note (Signed)
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   He does not want to pursue any surgery now so will check xray and suggest otc analgesaics

## 2014-04-24 NOTE — Assessment & Plan Note (Signed)
Not at goal.  He will monitor and may need to add second med.  Check labs

## 2014-05-01 ENCOUNTER — Telehealth: Payer: Self-pay | Admitting: Family Medicine

## 2014-05-01 NOTE — Telephone Encounter (Signed)
Called left information with his wife Angelita Ingles Asked them to monitor his blood pressure with a cuff and call in numbers after about a week She agrees

## 2014-05-11 ENCOUNTER — Other Ambulatory Visit: Payer: Self-pay | Admitting: Family Medicine

## 2014-09-18 ENCOUNTER — Other Ambulatory Visit: Payer: Self-pay | Admitting: Family Medicine

## 2015-04-26 ENCOUNTER — Other Ambulatory Visit: Payer: Self-pay | Admitting: Family Medicine

## 2015-04-29 ENCOUNTER — Other Ambulatory Visit: Payer: Self-pay | Admitting: Family Medicine

## 2015-05-14 ENCOUNTER — Ambulatory Visit (INDEPENDENT_AMBULATORY_CARE_PROVIDER_SITE_OTHER): Payer: BLUE CROSS/BLUE SHIELD | Admitting: Family Medicine

## 2015-05-14 ENCOUNTER — Encounter: Payer: Self-pay | Admitting: Family Medicine

## 2015-05-14 VITALS — BP 129/84 | HR 70 | Temp 98.1°F | Ht 70.0 in | Wt 188.0 lb

## 2015-05-14 DIAGNOSIS — I10 Essential (primary) hypertension: Secondary | ICD-10-CM

## 2015-05-14 DIAGNOSIS — E785 Hyperlipidemia, unspecified: Secondary | ICD-10-CM

## 2015-05-14 DIAGNOSIS — M25562 Pain in left knee: Secondary | ICD-10-CM

## 2015-05-14 LAB — BASIC METABOLIC PANEL
BUN: 11 mg/dL (ref 7–25)
CO2: 23 mmol/L (ref 20–31)
Calcium: 9.3 mg/dL (ref 8.6–10.3)
Chloride: 106 mmol/L (ref 98–110)
Creat: 0.86 mg/dL (ref 0.70–1.33)
Glucose, Bld: 85 mg/dL (ref 65–99)
POTASSIUM: 4.4 mmol/L (ref 3.5–5.3)
Sodium: 140 mmol/L (ref 135–146)

## 2015-05-14 LAB — LDL CHOLESTEROL, DIRECT: LDL DIRECT: 51 mg/dL (ref <130)

## 2015-05-14 NOTE — Progress Notes (Signed)
   Subjective:    Patient ID: David Carrillo, male    DOB: 02/03/1960, 55 y.o.   MRN: [id number]  HPI  HYPERTENSION Disease Monitoring Home BP Monitoring (Severity) 120s/80s Symptoms - Chest pain- no    Dyspnea- no Medications(Modifying factors) Compliance-  daily. Lightheadedness-  no  Edema- no Timing - continuous  Duration - years ROS - See HPI  PMH Lab Review   POTASSIUM  Date Value Ref Range Status  04/24/2014 4.8 3.5 - 5.3 mEq/L Final   SODIUM  Date Value Ref Range Status  04/24/2014 142 135 - 145 mEq/L Final   CREAT  Date Value Ref Range Status  04/24/2014 0.93 0.50 - 1.35 mg/dL Final   CREATININE, SER  Date Value Ref Range Status  06/02/2009 0.87 0.40-1.50 mg/dL Final         HYPERLIPIDEMIA Symptoms Chest pain on exertion:  no   Leg claudication:   no Medications (modifying factor): Compliance- daily Right upper quadrant pain- no  Muscle aches- no Duration - years   Timing - continuous    Component Value Date/Time   CHOL 111 04/24/2014 0948   TRIG 89 04/24/2014 0948   HDL 47 04/24/2014 0948   VLDL 18 04/24/2014 0948   CHOLHDL 2.4 04/24/2014 0948    Knee pain Continues about the same.  Does not take any medications.  No soft tissue swelling or redness or pain in other joints except shoulders.  Is working on his weight   Chief Complaint noted Review of Symptoms - see HPI PMH - Smoking status noted.   Vital Signs reviewed    Review of Systems     Objective:   Physical Exam  Heart - Regular rate and rhythm.  No murmurs, gallops or rubs.    Lungs:  Normal respiratory effort, chest expands symmetrically. Lungs are clear to auscultation, no crackles or wheezes. Abdomen: soft and non-tender without masses, organomegaly or hernias noted.  No guarding or rebound Extremities:  No cyanosis, edema, or deformity noted with good range of motion of all major joints.        Assessment & Plan:

## 2015-05-14 NOTE — Patient Instructions (Signed)
Good to see you today!  Thanks for coming in.  Keep taking medications as you are  I will call you if your tests are not good.  Otherwise I will send you a letter.  If you do not hear from me with in 2 weeks please call our office.     Keep working on the weight aim is to get down to 175.  Smaller portions of everthing

## 2015-05-14 NOTE — Assessment & Plan Note (Signed)
Well controlled.  Check labs  

## 2015-05-14 NOTE — Assessment & Plan Note (Signed)
Stable check LDL

## 2015-05-14 NOTE — Assessment & Plan Note (Signed)
Stable.  He is not interested in medications.  Dicussed weight control

## 2015-05-15 ENCOUNTER — Encounter: Payer: Self-pay | Admitting: Family Medicine

## 2015-05-27 ENCOUNTER — Other Ambulatory Visit: Payer: Self-pay | Admitting: Family Medicine

## 2015-12-24 ENCOUNTER — Other Ambulatory Visit: Payer: Self-pay | Admitting: Family Medicine

## 2016-03-10 ENCOUNTER — Other Ambulatory Visit: Payer: Self-pay | Admitting: Family Medicine

## 2016-04-16 ENCOUNTER — Other Ambulatory Visit: Payer: Self-pay | Admitting: *Deleted

## 2016-04-19 MED ORDER — METOPROLOL TARTRATE 25 MG PO TABS: 25.0000 mg | ORAL_TABLET | Freq: Two times a day (BID) | ORAL | 0 refills | Status: DC

## 2016-04-27 ENCOUNTER — Other Ambulatory Visit: Payer: Self-pay | Admitting: *Deleted

## 2016-04-27 MED ORDER — METOPROLOL TARTRATE 25 MG PO TABS: 25.0000 mg | ORAL_TABLET | Freq: Two times a day (BID) | ORAL | 1 refills | Status: DC

## 2016-04-27 NOTE — Telephone Encounter (Signed)
Refill request for 90 day supply.  Danilyn Cocke L, RN  

## 2016-05-21 ENCOUNTER — Ambulatory Visit: Payer: Self-pay | Admitting: Family Medicine

## 2016-05-24 ENCOUNTER — Ambulatory Visit (INDEPENDENT_AMBULATORY_CARE_PROVIDER_SITE_OTHER): Payer: BLUE CROSS/BLUE SHIELD | Admitting: Family Medicine

## 2016-05-24 ENCOUNTER — Encounter: Payer: Self-pay | Admitting: Family Medicine

## 2016-05-24 VITALS — BP 118/69 | HR 81 | Temp 99.0°F | Wt 199.0 lb

## 2016-05-24 DIAGNOSIS — R05 Cough: Secondary | ICD-10-CM

## 2016-05-24 DIAGNOSIS — R059 Cough, unspecified: Secondary | ICD-10-CM

## 2016-05-24 MED ORDER — FLUTICASONE PROPIONATE 50 MCG/ACT NA SUSP: 2.0000 | Freq: Every day | NASAL | 6 refills | Status: DC

## 2016-05-24 MED ORDER — OXYMETAZOLINE HCL 0.05 % NA SOLN: 1.0000 | Freq: Two times a day (BID) | NASAL | 0 refills | Status: DC

## 2016-05-24 NOTE — Progress Notes (Signed)
   Subjective:  David Carrillo is a 56 y.o. male who presents to the [facility] today with a chief complaint of cough.   HPI:  Cough Symptoms started 2 months ago. Gradually worsened over that time. Cough is nonproductive and persistent throughout the day. Associated with rhinorrhea. No fevers. No chest pain. No night sweats. No heartburn or reflux symptoms. No sick contacts. Tried taking several OTC cough medications which did not help very much.   ROS: Per HPI  PMH: Smoking history reviewed.   Objective:  Physical Exam: BP 118/69   Pulse 81   Temp 99 F (37.2 C) (Oral)   Wt 199 lb (90.3 kg)   SpO2 97%   BMI 28.55 kg/m   Gen: NAD, resting comfortably HEENT: OP slightly erythematous, otherwise clear. Nares with clear discharge bilaterally. TMs clear bilaterally.  CV: RRR with no murmurs appreciated Pulm: NWOB, CTAB with no crackles, wheezes, or rhonchi MSK: no edema, cyanosis, or clubbing noted Skin: warm, dry Neuro: grossly normal, moves all extremities Psych: Normal affect and thought content  Assessment/Plan:  Cough Likely secondary to post-nasal drip given associated rhinorrhea. Lung exam normal and patient has no other red flag signs or symptoms concerning for malignancy, pneumonia, or other pulmonary disease. Will start afrin for 3 days and flonase. Strict return precautions reviewed. Follow up as needed.   Algis Greenhouse. Jerline Pain, [place] Resident PGY-3 05/24/2016 8:45 AM  cham

## 2016-05-24 NOTE — Patient Instructions (Signed)
Start the afrin and flonase. Stop the afrin within 3 days. Continue the flonase.  Come back in 1 week if your symptoms worsen or are not improving.  Take care,  Dr Jerline Pain

## 2016-05-27 ENCOUNTER — Other Ambulatory Visit: Payer: Self-pay | Admitting: Family Medicine

## 2016-06-02 ENCOUNTER — Encounter: Payer: Self-pay | Admitting: Family Medicine

## 2016-06-02 ENCOUNTER — Ambulatory Visit (INDEPENDENT_AMBULATORY_CARE_PROVIDER_SITE_OTHER): Payer: BLUE CROSS/BLUE SHIELD | Admitting: Family Medicine

## 2016-06-02 VITALS — BP 128/72 | HR 82 | Temp 98.1°F | Wt 198.0 lb

## 2016-06-02 DIAGNOSIS — J309 Allergic rhinitis, unspecified: Secondary | ICD-10-CM | POA: Diagnosis not present

## 2016-06-02 MED ORDER — LORATADINE 10 MG PO TABS: 10.0000 mg | ORAL_TABLET | Freq: Every day | ORAL | 0 refills | Status: DC

## 2016-06-02 NOTE — Patient Instructions (Addendum)
For your rhinitis,  - Use flonase like we discussed targeting the nasal mucosa - Add on claritin (loratidine) 10mg  daily. - You can use a saline nasal spray as needed to keep your nose washed out of allergens.  Please let us know if there is no improvement after using these consistently for about 1-2 weeks.  Take care and seek immediate care sooner if you develop any concerns.  Dr. Bufford Lope, [place]

## 2016-06-02 NOTE — Assessment & Plan Note (Signed)
Cough likely from post nasal drip from allergic rhinitis. Discussed proper use of flonase, aiming slight to side of midline. Will also add loratidine and trial this for 1-2 weeks. If no improvement may need to add nasal antihistamine.

## 2016-06-02 NOTE — Progress Notes (Signed)
    Subjective:  David Carrillo is a 56 y.o. male who presents to the [facility] today for follow up on runny nose and cough.  HPI:  Has had cough and rhinorrhea for several months, last seen in clinic on 05/24/16. No improvement in symptoms but also no worsening. Nonproductive cough, clear nasal drainage. No fevers/chills, sore throat, nightsweats, chest pain, epigastric burning. No sick contacts. No new exposures to dust/pollen/etc. Has tried flonase without relief but admits that aims it directly backwards and can feel it drip down the back of his throat. Has tried zyrtec OTC in the past without relief.   ROS: Per HPI  Objective:  Physical Exam: BP 128/72   Pulse 82   Temp 98.1 F (36.7 C) (Oral)   Wt 198 lb (89.8 kg)   SpO2 97%   BMI 28.41 kg/m   Gen: NAD, resting comfortably HEENT: TMs clear bilaterally. Oropharynx mildly erythematous.  CV: RRR with no murmurs appreciated Pulm: NWOB, CTAB with no crackles, wheezes, or rhonchi GI: Normal bowel sounds present. Soft, Nontender, Nondistended. MSK: no edema, cyanosis, or clubbing noted Skin: warm, dry Neuro: grossly normal, moves all extremities Psych: Normal affect and thought content   Assessment/Plan:  Allergic rhinitis Cough likely from post nasal drip from allergic rhinitis. Discussed proper use of flonase, aiming slight to side of midline. Will also add loratidine and trial this for 1-2 weeks. If no improvement may need to add nasal antihistamine.   Bufford Lope, DO PGY-1, [facility] 06/02/2016 4:00 PM

## 2016-06-24 ENCOUNTER — Other Ambulatory Visit: Payer: Self-pay | Admitting: Family Medicine

## 2016-07-02 ENCOUNTER — Other Ambulatory Visit: Payer: Self-pay | Admitting: Family Medicine

## 2016-10-23 ENCOUNTER — Other Ambulatory Visit: Payer: Self-pay | Admitting: Family Medicine

## 2016-11-13 ENCOUNTER — Other Ambulatory Visit: Payer: Self-pay | Admitting: Family Medicine

## 2017-02-04 ENCOUNTER — Other Ambulatory Visit: Payer: Self-pay | Admitting: *Deleted

## 2017-02-07 MED ORDER — METOPROLOL TARTRATE 25 MG PO TABS: 25.0000 mg | ORAL_TABLET | Freq: Two times a day (BID) | ORAL | 0 refills | Status: DC

## 2017-02-16 ENCOUNTER — Encounter: Payer: Self-pay | Admitting: Family Medicine

## 2017-02-16 ENCOUNTER — Ambulatory Visit: Payer: BLUE CROSS/BLUE SHIELD | Admitting: Family Medicine

## 2017-02-16 DIAGNOSIS — Z114 Encounter for screening for human immunodeficiency virus [HIV]: Secondary | ICD-10-CM

## 2017-02-16 DIAGNOSIS — I10 Essential (primary) hypertension: Secondary | ICD-10-CM | POA: Diagnosis not present

## 2017-02-16 DIAGNOSIS — S61412D Laceration without foreign body of left hand, subsequent encounter: Secondary | ICD-10-CM

## 2017-02-16 DIAGNOSIS — Z1159 Encounter for screening for other viral diseases: Secondary | ICD-10-CM | POA: Diagnosis not present

## 2017-02-16 DIAGNOSIS — S61419A Laceration without foreign body of unspecified hand, initial encounter: Secondary | ICD-10-CM | POA: Insufficient documentation

## 2017-02-16 NOTE — Assessment & Plan Note (Signed)
Improving.  Cautioned on being careful to not let open up again

## 2017-02-16 NOTE — Patient Instructions (Signed)
Good to see you today!  Thanks for coming in.  I will call you if your tests are not good.  Otherwise I will send you a letter.  If you do not hear from me with in 2 weeks please call our office.     Keep the steri strips on as long as possible and protect the hand with a wrap   Come back in 6-12 mo for a check up or if your blood pressure is not controlled

## 2017-02-16 NOTE — Assessment & Plan Note (Signed)
At goal.  Check labs. 

## 2017-02-16 NOTE — Progress Notes (Signed)
Subjective  Patient is presenting with the following illnesses  HAND LACERATION About 2 weeks ago.  ER note reviewed (no tendon or muscle damage)  Pain is decreasing and no fever or pus or weakness  HYPERTENSION Disease Monitoring  Home BP Monitoring (Severity) usually well controlled Symptoms - Chest pain- no    Dyspnea- no Medications (Modifying factors) Compliance-  daily. Lightheadedness-  no  Edema- no Timing - continuous  Duration - years ROS - See HPI  PMH Lab Review   Potassium  Date Value Ref Range Status  05/14/2015 4.4 3.5 - 5.3 mmol/L Final   Sodium  Date Value Ref Range Status  05/14/2015 140 135 - 146 mmol/L Final   Creat  Date Value Ref Range Status  05/14/2015 0.86 0.70 - 1.33 mg/dL Final      Chief Complaint noted Review of Symptoms - see HPI PMH - Smoking status noted.     Objective Vital Signs reviewed Left Hand - well healed laceration without redness or discharge.  Stitches removed #11 without problems.  Steri Stripped     Assessments/Plans  Hand laceration Improving.  Cautioned on being careful to not let open up again   HYPERTENSION, BENIGN ESSENTIAL At goal.  Check labs    See after visit summary for details of patient instuctions

## 2017-02-17 ENCOUNTER — Encounter: Payer: Self-pay | Admitting: Family Medicine

## 2017-02-17 LAB — BASIC METABOLIC PANEL
BUN / CREAT RATIO: 12 (ref 9–20)
BUN: 10 mg/dL (ref 6–24)
CALCIUM: 9.1 mg/dL (ref 8.7–10.2)
CHLORIDE: 105 mmol/L (ref 96–106)
CO2: 21 mmol/L (ref 20–29)
Creatinine, Ser: 0.81 mg/dL (ref 0.76–1.27)
GFR calc non Af Amer: 98 mL/min/{1.73_m2} (ref >59)
GFR, EST AFRICAN AMERICAN: 113 mL/min/{1.73_m2} (ref >59)
GLUCOSE: 95 mg/dL (ref 65–99)
POTASSIUM: 4.2 mmol/L (ref 3.5–5.2)
Sodium: 141 mmol/L (ref 134–144)

## 2017-02-17 LAB — HEPATITIS C ANTIBODY: Hep C Virus Ab: <0.1 s/co ratio (ref 0.0–0.9)

## 2017-02-17 LAB — HIV ANTIBODY (ROUTINE TESTING W REFLEX): HIV Screen 4th Generation wRfx: NONREACTIVE

## 2017-03-19 ENCOUNTER — Other Ambulatory Visit: Payer: Self-pay | Admitting: Family Medicine

## 2017-05-17 ENCOUNTER — Other Ambulatory Visit: Payer: Self-pay | Admitting: Family Medicine

## 2017-07-21 ENCOUNTER — Ambulatory Visit (INDEPENDENT_AMBULATORY_CARE_PROVIDER_SITE_OTHER): Payer: BLUE CROSS/BLUE SHIELD | Admitting: Family Medicine

## 2017-07-21 ENCOUNTER — Encounter: Payer: Self-pay | Admitting: Family Medicine

## 2017-07-21 VITALS — BP 120/65 | HR 90 | Temp 98.5°F | Ht 70.0 in | Wt 202.2 lb

## 2017-07-21 DIAGNOSIS — Z122 Encounter for screening for malignant neoplasm of respiratory organs: Secondary | ICD-10-CM | POA: Diagnosis not present

## 2017-07-21 DIAGNOSIS — R059 Cough, unspecified: Secondary | ICD-10-CM

## 2017-07-21 DIAGNOSIS — R05 Cough: Secondary | ICD-10-CM | POA: Diagnosis not present

## 2017-07-21 MED ORDER — FLUTICASONE PROPIONATE 50 MCG/ACT NA SUSP: 2.0000 | Freq: Every day | NASAL | 6 refills | Status: DC

## 2017-07-21 NOTE — Progress Notes (Signed)
Subjective   Patient ID: David Carrillo    DOB: 06/19/1960, 57 y.o. male   MRN: [id number]  CC: "Cough"  HPI: David Carrillo is a 57 y.o. male who presents for a same day appointment for the following:  Cough: Patient has 2-week history of nonproductive cough.  Patient has tried Mucinex and Zyrtec for 2 weeks but cough is "not letting up."  He does endorse blowing his nose frequently all year long and feels like he does have some mucus running in the back of his throat.  He does report history of reflux with Zantac use over one year ago but does not recall having an associated cough.  Patient denies chest pain, shortness of breath, hemoptysis, fevers or chills, rash, hematuria.  He is a former smoker.  History of smoking: Patient has a 30-pack-year history.  He quit smoking in 2004.  He has never received a low contrast CT.  ROS: see HPI for pertinent.  [name]: HTN, HLD, right rotator cuff injury, allergies, GERD.  Surgical history shoulder arthroscopy.  Family history unremarkable.  Smoking status reviewed. Medications reviewed.  Objective   BP 120/65 (BP Location: Left Arm, Patient Position: Sitting, Cuff Size: Normal)   Pulse 90   Temp 98.5 F (36.9 C) (Oral)   Ht 5\' 10"  (1.778 m)   Wt 202 lb 3.2 oz (91.7 kg)   SpO2 96%   BMI 29.01 kg/m  Vitals and nursing note reviewed.  General: well nourished, well developed, NAD with non-toxic appearance HEENT: normocephalic, atraumatic, moist mucous membranes, nonerythematous pharynx without tonsillar edema, patent nares bilaterally without signs of polyps Neck: supple, non-tender without lymphadenopathy Cardiovascular: regular rate and rhythm without murmurs, rubs, or gallops Lungs: clear to auscultation bilaterally with normal work of breathing, dry cough present Abdominal: soft, nontender, normal active bowel sounds Skin: warm, dry, no rashes or lesions, cap refill < 2 seconds Extremities: warm and well perfused, normal tone, no  edema  Assessment & Plan   Cough Acute.  Suspicious for chronic allergic rhinitis based on history.  Could be reflux given history of GERD though less likely.  No obvious signs of postnasal drip though patient does endorse symptoms of feeling such.  Lungs clear to auscultation without wheeze making uncontrolled asthma less likely.  Unlikely post viral given lack of URI symptoms.  Patient does have a significant history of smoking but is a former smoker. - Given prescription for Flonase, 2 sprays in each nare daily - Reviewed return precautions - RTC in 4 weeks if symptoms persist  Encounter for screening for malignant neoplasm of respiratory organs Patient with a 30-pack-year history and reports smoking within the last 15 years.  Appears to be a good candidate for lung cancer screening. - Checking LDCT  Orders Placed This Encounter  Procedures  . CT CHEST LUNG CA SCREEN LOW DOSE W/O CM    Epic Wt 200/ no needs/ ins bcbs/ SF    Standing Status:   Future    Standing Expiration Date:   09/22/2018    Order Specific Question:   Reason for Exam (SYMPTOM  OR DIAGNOSIS REQUIRED)    Answer:   Smoker    Order Specific Question:   Preferred Imaging Location?    Answer:   [id number]    Order Specific Question:   Radiology Contrast Protocol - do NOT remove file path    Answer:   [email or web address]   Meds ordered this encounter  Medications  . fluticasone (FLONASE) 50 MCG/ACT nasal  spray    Sig: Place 2 sprays into both nostrils daily.    Dispense:  16 g    Refill:  [place], [place], PGY-3 07/22/2017, 9:42 AM

## 2017-07-21 NOTE — Patient Instructions (Signed)
Thank you for coming in to see [place] today. Please see below to review our plan for today's visit.  1.  Take 2 sprays of the Flonase in each nostril daily for 2 weeks.  If your symptoms persist for 6 weeks, would like you to be seen in the clinic again. 2.  I have placed an order for a low contrast CT to evaluate for lung cancer given your history of smoking.  I do not believe your coughing is related to cancer but you do meet criteria for this image study which can save your life.  Please call the clinic at [phone] if your symptoms worsen or you have any concerns. It was our pleasure to serve you.  Harriet [place], [place], PGY-3

## 2017-07-22 DIAGNOSIS — Z122 Encounter for screening for malignant neoplasm of respiratory organs: Secondary | ICD-10-CM | POA: Insufficient documentation

## 2017-07-22 NOTE — Assessment & Plan Note (Signed)
Acute.  Suspicious for chronic allergic rhinitis based on history.  Could be reflux given history of GERD though less likely.  No obvious signs of postnasal drip though patient does endorse symptoms of feeling such.  Lungs clear to auscultation without wheeze making uncontrolled asthma less likely.  Unlikely post viral given lack of URI symptoms.  Patient does have a significant history of smoking but is a former smoker. - Given prescription for Flonase, 2 sprays in each nare daily - Reviewed return precautions - RTC in 4 weeks if symptoms persist

## 2017-07-22 NOTE — Assessment & Plan Note (Signed)
Patient with a 30-pack-year history and reports smoking within the last 15 years.  Appears to be a good candidate for lung cancer screening. - Checking LDCT

## 2017-08-09 ENCOUNTER — Ambulatory Visit
Admission: RE | Admit: 2017-08-09 | Discharge: 2017-08-09 | Disposition: A | Discharge status: Home / Self Care | Payer: BLUE CROSS/BLUE SHIELD | Source: Ambulatory Visit | Attending: Family Medicine | Admitting: Family Medicine

## 2017-08-09 DIAGNOSIS — Z122 Encounter for screening for malignant neoplasm of respiratory organs: Secondary | ICD-10-CM

## 2017-08-10 ENCOUNTER — Telehealth: Payer: Self-pay | Admitting: Family Medicine

## 2017-08-10 ENCOUNTER — Encounter: Payer: Self-pay | Admitting: Family Medicine

## 2017-08-10 NOTE — Telephone Encounter (Signed)
Attempted to contact patient regarding LDCT, no voicemail.  Findings showed benign-appearing nodule on lung which will need a one-year follow-up.  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  Patient is already on high intensity statin and aspirin daily.  Will send letter to patient.  Harriet Butte, [place], PGY-3

## 2017-09-20 ENCOUNTER — Other Ambulatory Visit: Payer: Self-pay | Admitting: Family Medicine

## 2018-01-02 ENCOUNTER — Ambulatory Visit: Payer: BLUE CROSS/BLUE SHIELD | Admitting: Family Medicine

## 2018-01-02 DIAGNOSIS — E785 Hyperlipidemia, unspecified: Secondary | ICD-10-CM | POA: Insufficient documentation

## 2018-01-02 DIAGNOSIS — R05 Cough: Secondary | ICD-10-CM

## 2018-01-02 DIAGNOSIS — I10 Essential (primary) hypertension: Secondary | ICD-10-CM | POA: Diagnosis not present

## 2018-01-02 DIAGNOSIS — R059 Cough, unspecified: Secondary | ICD-10-CM

## 2018-01-02 DIAGNOSIS — K4090 Unilateral inguinal hernia, without obstruction or gangrene, not specified as recurrent: Secondary | ICD-10-CM | POA: Insufficient documentation

## 2018-01-02 NOTE — Assessment & Plan Note (Signed)
At goal.  

## 2018-01-02 NOTE — Assessment & Plan Note (Signed)
New.  Discussed possible treatment and cautions and signs of incarceration.  He will consider and let me know if he would like a referral

## 2018-01-02 NOTE — Progress Notes (Signed)
Subjective  David Carrillo is a 57 y.o. male is presenting with the following  HERNIA Noticed fullness with mild pain in R groin after lifting.  Always goes down and no vomiting or severe pain.  Never had hernia before  HYPERLIPIDEMIA Symptoms Chest pain on exertion:  no   Leg claudication:   no Medications (modifying factor): Compliance- daily lipitor Right upper quadrant pain- no  Muscle aches- mild Duration - years   Timing - continuous    Component Value Date/Time   CHOL 111 04/24/2014 0948   TRIG 89 04/24/2014 0948   HDL 47 04/24/2014 0948   LDLDIRECT 51 05/14/2015 1202   VLDL 18 04/24/2014 0948   CHOLHDL 2.4 04/24/2014 0948   COUGH Continues to have chronic cough.  Trial of nasal steroid did not help.  Lung CT in July did not show a cause.  Used to have lots of heart burn but not recently.  No weight loss or bleeding   Chief Complaint noted Review of Symptoms - see HPI PMH - Smoking status noted.    Objective Vital Signs reviewed BP 126/70   Pulse 90   Temp 98.6 F (37 C) (Oral)   Wt 196 lb 12.8 oz (89.3 kg)   SpO2 95%   BMI 28.24 kg/m  Abdomen: soft and non-tender without organomegaly Right lower abdomen fullness that is reducible above inguinal crease.  No guarding or rebound  Assessments/Plans  See after visit summary for details of patient instuctions  Cough Chronic with neg lung CT and no response to nasal steroids.  Trial of PPI   Hyperlipidemia Check labs   HYPERTENSION, BENIGN ESSENTIAL At goal   Hernia, inguinal, right New.  Discussed possible treatment and cautions and signs of incarceration.  He will consider and let me know if he would like a referral

## 2018-01-02 NOTE — Assessment & Plan Note (Signed)
Chronic with neg lung CT and no response to nasal steroids.  Trial of PPI

## 2018-01-02 NOTE — Patient Instructions (Signed)
Good to see you today!  Thanks for coming in.  For the hernia - let me know if you want to be referred to a surgeon - If you can not push it back or it is hard and painful or you have a lot of vomting go to the ER  Cough - try omeprazole 20 mg twice a day for one month to see if helps  I will call you if your tests are not good.  Otherwise I will send you a letter.  If you do not hear from me with in 2 weeks please call our office.

## 2018-01-02 NOTE — Assessment & Plan Note (Signed)
Check labs 

## 2018-01-03 LAB — LIPID PANEL
Chol/HDL Ratio: 2.9 ratio (ref 0.0–5.0)
Cholesterol, Total: 126 mg/dL (ref 100–199)
HDL: 43 mg/dL (ref >39)
LDL CALC: 43 mg/dL (ref 0–99)
Triglycerides: 202 mg/dL — ABNORMAL HIGH (ref 0–149)
VLDL CHOLESTEROL CAL: 40 mg/dL (ref 5–40)

## 2018-01-04 ENCOUNTER — Encounter: Payer: Self-pay | Admitting: Family Medicine

## 2018-01-24 ENCOUNTER — Other Ambulatory Visit: Payer: Self-pay

## 2018-01-24 ENCOUNTER — Ambulatory Visit: Payer: BLUE CROSS/BLUE SHIELD | Admitting: Family Medicine

## 2018-01-24 VITALS — BP 110/60 | HR 95 | Temp 99.1°F | Wt 196.0 lb

## 2018-01-24 DIAGNOSIS — R053 Chronic cough: Secondary | ICD-10-CM

## 2018-01-24 DIAGNOSIS — R05 Cough: Secondary | ICD-10-CM | POA: Diagnosis not present

## 2018-01-24 MED ORDER — ALBUTEROL SULFATE 108 (90 BASE) MCG/ACT IN AEPB: 2.0000 | INHALATION_SPRAY | RESPIRATORY_TRACT | 0 refills | Status: DC | PRN

## 2018-01-24 NOTE — Assessment & Plan Note (Signed)
Patient presenting with chronic cough.  No improvement on nasal steroids, PPI.  Chest CT did not show any cancer, but did show COPD-like findings.  Given his history of smoking may have emphysematous changes consistent with atypical presentation of COPD.  Will trial albuterol inhaler and obtain PFTs.  Patient follow-up in 3 weeks.

## 2018-01-24 NOTE — Progress Notes (Signed)
Acute Office Visit  Subjective:    Patient ID: David Carrillo, male    DOB: [birth year], 58 y.o.   MRN: [id number]  Chief Complaint  Patient presents with  . Cough    Patient CT chest 07/2017 showed bronchitic changes similar to COPD.  Patient has history of smoking.  Patient has trialed PPI without improvement.  He is never used an inhaler.  Does not have any history of using inhaler or being told he had COPD.  Cough  This is a chronic problem. Episode onset: 3 years. The problem has been unchanged. The problem occurs every few minutes. The cough is non-productive. Associated symptoms include shortness of breath. Pertinent negatives include no chest pain, chills, ear congestion, ear pain, fever, headaches, heartburn, hemoptysis, myalgias, nasal congestion, postnasal drip, rash, rhinorrhea, sore throat, sweats, weight loss or wheezing. Nothing aggravates the symptoms. Risk factors: history of smoking. He has tried OTC cough suppressant (PPI) for the symptoms. The treatment provided no relief. His past medical history is significant for COPD. There is no history of asthma, bronchiectasis, bronchitis, emphysema, environmental allergies or pneumonia.     Past Medical History:  Diagnosis Date  . GERD (gastroesophageal reflux disease)   . Hypertension    borderline-no meds  . Neuromuscular disorder ([place])    some numbness rt hand-7/13    Past Surgical History:  Procedure Laterality Date  . SHOULDER ARTHROSCOPY  3 years ago    Family History  Problem Relation Age of Onset  . Colon cancer Neg Hx     Social History   Socioeconomic History  . Marital status: Married    Spouse name: Not on file  . Number of children: Not on file  . Years of education: Not on file  . Highest education level: Not on file  Occupational History  . Not on file  Social Needs  . Financial resource strain: Not on file  . Food insecurity:    Worry: Not on file    Inability: Not on file  .  Transportation needs:    Medical: Not on file    Non-medical: Not on file  Tobacco Use  . Smoking status: Former Smoker    Last attempt to quit: 08/05/2002    Years since quitting: 15.4  . Smokeless tobacco: Never Used  Substance and Sexual Activity  . Alcohol use: No  . Drug use: No  . Sexual activity: Not on file  Lifestyle  . Physical activity:    Days per week: Not on file    Minutes per session: Not on file  . Stress: Not on file  Relationships  . Social connections:    Talks on phone: Not on file    Gets together: Not on file    Attends religious service: Not on file    Active member of club or organization: Not on file    Attends meetings of clubs or organizations: Not on file    Relationship status: Not on file  . Intimate partner violence:    Fear of current or ex partner: Not on file    Emotionally abused: Not on file    Physically abused: Not on file    Forced sexual activity: Not on file  Other Topics Concern  . Not on file  Social History Narrative  . Not on file    Outpatient Medications Prior to Visit  Medication Sig Dispense Refill  . aspirin 81 MG tablet Take 81 mg by mouth daily. Reported on 05/14/2015    .  atorvastatin (LIPITOR) 40 MG tablet TAKE 1 TABLET EVERY DAY 90 tablet 1  . fluticasone (FLONASE) 50 MCG/ACT nasal spray Place 2 sprays into both nostrils daily. 16 g 6  . metoprolol tartrate (LOPRESSOR) 25 MG tablet TAKE 1 TABLET BY MOUTH TWICE A DAY 180 tablet 3   No facility-administered medications prior to visit.     No Known Allergies  Review of Systems  Constitutional: Negative for chills, fever and weight loss.  HENT: Negative for ear pain, postnasal drip, rhinorrhea and sore throat.   Respiratory: Positive for cough and shortness of breath. Negative for hemoptysis and wheezing.   Cardiovascular: Negative for chest pain.  Gastrointestinal: Negative for heartburn.  Musculoskeletal: Negative for myalgias.  Skin: Negative for rash.    Neurological: Negative for headaches.  Endo/Heme/Allergies: Negative for environmental allergies.       Objective:    Physical Exam  Constitutional: He is oriented to person, place, and time. He appears well-developed. No distress.  HENT:  Head: Normocephalic and atraumatic.  Eyes: Conjunctivae are normal. Right eye exhibits no discharge. Left eye exhibits no discharge.  Neck: No JVD present.  Cardiovascular: Normal rate and regular rhythm.  Pulmonary/Chest: Effort normal and breath sounds normal.  Abdominal: Soft. He exhibits no distension. There is no abdominal tenderness.  Musculoskeletal: Normal range of motion.        General: No deformity or edema.  Neurological: He is alert and oriented to person, place, and time.  Skin: Skin is warm and dry.  Psychiatric: He has a normal mood and affect. His behavior is normal.  Vitals reviewed.   BP 110/60   Pulse 95   Temp 99.1 F (37.3 C) (Oral)   Wt 196 lb (88.9 kg)   SpO2 99%   BMI 28.12 kg/m  Wt Readings from Last 3 Encounters:  01/24/18 196 lb (88.9 kg)  01/02/18 196 lb 12.8 oz (89.3 kg)  07/21/17 202 lb 3.2 oz (91.7 kg)    Health Maintenance Due  Topic Date Due  . DTaP/Tdap/Td (1 - Tdap) 05/28/1969  . INFLUENZA VACCINE  08/11/2017    There are no preventive care reminders to display for this patient.   No results found for: TSH Lab Results  Component Value Date   WBC 4.5 09/19/2012   HGB 16.0 09/19/2012   HCT 44.8 09/19/2012   MCV 92.6 09/19/2012   PLT 209 09/19/2012   Lab Results  Component Value Date   NA 141 02/16/2017   K 4.2 02/16/2017   CO2 21 02/16/2017   GLUCOSE 95 02/16/2017   BUN 10 02/16/2017   CREATININE 0.81 02/16/2017   BILITOT 0.5 06/02/2009   ALKPHOS 51 06/02/2009   AST 17 06/02/2009   ALT 19 06/02/2009   PROT 6.9 06/02/2009   ALBUMIN 4.4 06/02/2009   CALCIUM 9.1 02/16/2017   Lab Results  Component Value Date   CHOL 126 01/02/2018   Lab Results  Component Value Date   HDL  43 01/02/2018   Lab Results  Component Value Date   LDLCALC 43 01/02/2018   Lab Results  Component Value Date   TRIG 202 (H) 01/02/2018   Lab Results  Component Value Date   CHOLHDL 2.9 01/02/2018   No results found for: HGBA1C     Assessment & Plan:   Problem List Items Addressed This Visit      Other   Chronic cough - Primary    Patient presenting with chronic cough.  No improvement on nasal steroids, PPI.  Chest CT did not show any cancer, but did show COPD-like findings.  Given his history of smoking may have emphysematous changes consistent with atypical presentation of COPD.  Will trial albuterol inhaler and obtain PFTs.  Patient follow-up in 3 weeks.      Relevant Medications   Albuterol Sulfate (PROAIR RESPICLICK) 601 (90 Base) MCG/ACT AEPB       Meds ordered this encounter  Medications  . Albuterol Sulfate (PROAIR RESPICLICK) 093 (90 Base) MCG/ACT AEPB    Sig: Inhale 2 puffs into the lungs every 4 (four) hours as needed (cough and shortness of breath).    Dispense:  1 each    Refill:  0     Bonnita Hollow, MD

## 2018-01-31 ENCOUNTER — Telehealth: Payer: Self-pay | Admitting: *Deleted

## 2018-01-31 MED ORDER — BENZONATATE 100 MG PO CAPS: 100.0000 mg | ORAL_CAPSULE | Freq: Two times a day (BID) | ORAL | 1 refills | Status: DC | PRN

## 2018-01-31 NOTE — Telephone Encounter (Signed)
Please let him know   I sent this in  If not better in a few weeks or worsening we should see him in the office  Thanks

## 2018-01-31 NOTE — Telephone Encounter (Signed)
Called and LVM for patient on RX for Tessalon Perles and to come in if cough got worse or did not improve.  Ozella Almond, [place]

## 2018-01-31 NOTE — Telephone Encounter (Signed)
Pt wife states that the the "puffer" is not helping with pt cough.  They are requesting a script for tessalon pearles.  Will forward to Md who saw patients. Amori Colomb, Salome Spotted, CMA

## 2018-02-14 ENCOUNTER — Other Ambulatory Visit: Payer: Self-pay

## 2018-02-14 ENCOUNTER — Encounter: Payer: Self-pay | Admitting: Family Medicine

## 2018-02-14 ENCOUNTER — Ambulatory Visit (INDEPENDENT_AMBULATORY_CARE_PROVIDER_SITE_OTHER): Payer: BLUE CROSS/BLUE SHIELD | Admitting: Family Medicine

## 2018-02-14 DIAGNOSIS — R05 Cough: Secondary | ICD-10-CM

## 2018-02-14 DIAGNOSIS — R053 Chronic cough: Secondary | ICD-10-CM

## 2018-02-14 DIAGNOSIS — I10 Essential (primary) hypertension: Secondary | ICD-10-CM

## 2018-02-14 NOTE — Patient Instructions (Addendum)
Good to see you today!  Thanks for coming in.  Stop the metoprolol and follow your blood pressure.  If your blood pressure stays below 140/90 then you are ok  Schedule for for PFTs with Dr Valentina Lucks  I will refer you to ENT doctor - if you don't hear from them in 2 weeks then call me  Try Nasal Saline many times a day

## 2018-02-14 NOTE — Progress Notes (Signed)
Subjective  Patient was supposed to have been scheduled for PFTs but appeared on my schedule by mistake

## 2018-02-14 NOTE — Assessment & Plan Note (Signed)
Blood pressure has been well controlled last few visits.  Will stop metoprolol.  Patient does not have CAD and was on this for hypertension only per last cardiology note

## 2018-02-14 NOTE — Assessment & Plan Note (Signed)
Will refer to ENT as well as getting PFTs

## 2018-02-23 ENCOUNTER — Encounter: Payer: Self-pay | Admitting: Pharmacist

## 2018-02-23 ENCOUNTER — Ambulatory Visit (INDEPENDENT_AMBULATORY_CARE_PROVIDER_SITE_OTHER): Payer: BLUE CROSS/BLUE SHIELD | Admitting: Pharmacist

## 2018-02-23 DIAGNOSIS — R05 Cough: Secondary | ICD-10-CM

## 2018-02-23 DIAGNOSIS — R053 Chronic cough: Secondary | ICD-10-CM

## 2018-02-23 NOTE — Patient Instructions (Addendum)
STOP taking aspirin for one week to see if this improves your cough  RESTART fluticasone nasal spray (Flonase) OTC as this may help your symptoms.  Please call the clinic with any questions that you have.

## 2018-02-23 NOTE — Assessment & Plan Note (Signed)
Patient has been experiencing coughing and nasal congestion for ~3 years and has tried taking benzonatate and flonase with no relief. He is currently on PRN albuterol.  -Spirometry evaluation did not reveal restrictive lung disease.  -Post nebulized albuterol tx revealed near-normal changes in lung function. -Continue albuterol PRN -Counseled to stop taking aspirin for 1 week as this may be exacerbating cough -Counseled to restart OTC Flonase: 2 sprays in each nostril daily to help with congestion -Reviewed results of pulmonary function tests.  Pt verbalized understanding of results and education.

## 2018-02-23 NOTE — Progress Notes (Signed)
   S:    Patient arrives healthy appearing and in good spirits.  Presents for lung function evaluation.   Patient was referred by Dr. Erin Hearing (referred on 02/14/2018).  Patient was last seen by Primary Care Provider on 02/14/2018.  Patient reports his cough has been ongoing for the last 3 years, with nasal congestion. His breathing today is the same as it has been over the last 6 months.  Never been told he has asthma or COPD. Quit smoking in 2004. He states that nothing has helped his cough, including his benzonatate and albuterol use. He has also previously taken an antacid for possible GERD related cough and has tried antihistamines with no benefit. He notes he has not used albuterol since Tuesday in preparation for this PFT today.   Patient reports adherence to medications Current medications: albuterol PRN Rescue inhaler use frequency: has been using twice daily without any effectiveness Patient exacerbation hx: none   O:  Physical Exam Constitutional:      Appearance: Normal appearance.  Neurological:     Mental Status: He is alert.    Review of Systems  Respiratory: Positive for cough.   All other systems reviewed and are negative.    Vitals:   02/23/18 1551  BP: (!) 136/92    CAT score= 16 See Documentation Flowsheet - CAT/COPD for complete symptom scoring.  See "scanned report" or Documentation Flowsheet (discrete results - PFTs) for Spirometry results. Patient provided good effort while attempting spirometry.   Lung Age = 68 Albuterol Neb  Lot# [id number]     Exp. 09/2019  A/P: Patient has been experiencing coughing and nasal congestion for ~3 years and has tried taking benzonatate and flonase with no relief. He is currently on PRN albuterol.  -Spirometry evaluation did not reveal restrictive lung disease.  -Post nebulized albuterol tx revealed near-normal changes in lung function. -Continue albuterol PRN -Counseled to stop taking aspirin for 1 week as this may be  exacerbating cough -Counseled to restart OTC Flonase: 2 sprays in each nostril daily to help with congestion -Reviewed results of pulmonary function tests.  Pt verbalized understanding of results and education.    Written pt instructions provided.  No follow-up clinic visit. Total time in face to face counseling 40 minutes.  Patient seen with Emeline General, PharmD Candidate, Janae Bridgeman, PharmD, PGY1 resident and Courtney Heys, PharmD,  PGY2 Pharmacy Resident.

## 2018-02-23 NOTE — Progress Notes (Signed)
Patient ID: David Carrillo, male   DOB: Jan 21, 1960, 58 y.o.   MRN: [id number] Reviewed: Agree with Dr. Graylin Shiver documentation and management.

## 2018-03-20 ENCOUNTER — Other Ambulatory Visit: Payer: Self-pay | Admitting: Family Medicine

## 2018-04-04 ENCOUNTER — Institutional Professional Consult (permissible substitution): Payer: Self-pay | Admitting: Pulmonary Disease

## 2018-06-12 ENCOUNTER — Other Ambulatory Visit: Payer: Self-pay | Admitting: Family Medicine

## 2018-09-21 ENCOUNTER — Other Ambulatory Visit: Payer: Self-pay | Admitting: Family Medicine

## 2018-10-05 ENCOUNTER — Other Ambulatory Visit: Payer: Self-pay | Admitting: Family Medicine

## 2018-10-06 ENCOUNTER — Other Ambulatory Visit: Payer: Self-pay

## 2018-10-06 DIAGNOSIS — R053 Chronic cough: Secondary | ICD-10-CM

## 2018-10-06 DIAGNOSIS — R05 Cough: Secondary | ICD-10-CM

## 2018-10-06 MED ORDER — PROAIR RESPICLICK 108 (90 BASE) MCG/ACT IN AEPB: 2.0000 | INHALATION_SPRAY | RESPIRATORY_TRACT | 1 refills | Status: DC | PRN

## 2018-10-24 ENCOUNTER — Other Ambulatory Visit: Payer: Self-pay

## 2018-10-24 ENCOUNTER — Telehealth (INDEPENDENT_AMBULATORY_CARE_PROVIDER_SITE_OTHER): Payer: BC Managed Care – PPO | Admitting: Family Medicine

## 2018-10-24 DIAGNOSIS — R053 Chronic cough: Secondary | ICD-10-CM

## 2018-10-24 DIAGNOSIS — R05 Cough: Secondary | ICD-10-CM

## 2018-10-24 DIAGNOSIS — E785 Hyperlipidemia, unspecified: Secondary | ICD-10-CM

## 2018-10-24 DIAGNOSIS — I10 Essential (primary) hypertension: Secondary | ICD-10-CM

## 2018-10-24 DIAGNOSIS — Z122 Encounter for screening for malignant neoplasm of respiratory organs: Secondary | ICD-10-CM

## 2018-10-24 MED ORDER — INDAPAMIDE 1.25 MG PO TABS: 1.2500 mg | ORAL_TABLET | Freq: Every day | ORAL | 1 refills | Status: DC

## 2018-10-24 NOTE — Assessment & Plan Note (Signed)
Stable on current medications 

## 2018-10-24 NOTE — Progress Notes (Signed)
[place] Telemedicine Visit  Patient consented to have virtual visit. Method of visit: Video  Encounter participants: Patient: David Carrillo - located at work Provider: Lind Covert - located at office Others (if applicable): no  Chief Complaint: Cough  HPI:  COUGH His chronic cough which had gotten better with stopping metorpolol came back once he restarted when his blood pressure was high.  No fever or shortness of breath or noticeable wheeze or leg swelling   HYPERTENSION Disease Monitoring Home BP Monitoring (Severity) in the 170s/upper 90s before he restarted metoprolol then back to 130-40/90 Symptoms - Chest pain- no    Dyspnea- no Medications   Compliance-  See above. Lightheadedness-  no  Edema- no Timing - continuous  Duration - years   HYPERLIPIDEMIA Symptoms Chest pain on exertion:  no   Leg claudication:   no Medications ): Compliance- daily lipitor Right upper quadrant pain- no  Muscle aches- no Duration - years   Timing - continuous   ROS: per HPI  Pertinent PMHx: former smoker  Exam:  Respiratory: has dry cough.  No distress  Assessment/Plan:  Chronic cough Worsened when he restarted his metoprolol for increasing blood pressure.  May have component of COPD that bb worsened.  Have him stop and monitor.  Will order yearly lung ca screen CT  HYPERTENSION, BENIGN ESSENTIAL Worsened by report.  Stop metoprolol since associated with his cough and start indapamide daily.  Check bmet in one week   Hyperlipidemia Stable on current medications     Time spent during visit with patient: 15 minutes

## 2018-10-24 NOTE — Assessment & Plan Note (Signed)
Worsened when he restarted his metoprolol for increasing blood pressure.  May have component of COPD that bb worsened.  Have him stop and monitor.  Will order yearly lung ca screen CT

## 2018-10-24 NOTE — Assessment & Plan Note (Signed)
Worsened by report.  Stop metoprolol since associated with his cough and start indapamide daily.  Check bmet in one week

## 2018-11-15 ENCOUNTER — Other Ambulatory Visit: Payer: Self-pay | Admitting: Family Medicine

## 2018-11-30 ENCOUNTER — Other Ambulatory Visit: Payer: BC Managed Care – PPO

## 2018-11-30 ENCOUNTER — Other Ambulatory Visit: Payer: Self-pay

## 2018-11-30 DIAGNOSIS — I10 Essential (primary) hypertension: Secondary | ICD-10-CM

## 2018-12-01 ENCOUNTER — Encounter: Payer: Self-pay | Admitting: Family Medicine

## 2018-12-01 LAB — BASIC METABOLIC PANEL
BUN/Creatinine Ratio: 16 (ref 10–24)
BUN: 14 mg/dL (ref 8–27)
CO2: 21 mmol/L (ref 20–29)
Calcium: 8.8 mg/dL (ref 8.6–10.2)
Chloride: 107 mmol/L — ABNORMAL HIGH (ref 96–106)
Creatinine, Ser: 0.86 mg/dL (ref 0.76–1.27)
GFR calc Af Amer: 109 mL/min/{1.73_m2} (ref >59)
GFR calc non Af Amer: 94 mL/min/{1.73_m2} (ref >59)
Glucose: 96 mg/dL (ref 65–99)
Potassium: 4.1 mmol/L (ref 3.5–5.2)
Sodium: 143 mmol/L (ref 134–144)

## 2019-04-25 ENCOUNTER — Telehealth (INDEPENDENT_AMBULATORY_CARE_PROVIDER_SITE_OTHER): Payer: BC Managed Care – PPO | Admitting: Family Medicine

## 2019-04-25 ENCOUNTER — Other Ambulatory Visit: Payer: Self-pay | Admitting: Family Medicine

## 2019-04-25 ENCOUNTER — Other Ambulatory Visit: Payer: Self-pay

## 2019-04-25 DIAGNOSIS — M25562 Pain in left knee: Secondary | ICD-10-CM

## 2019-04-25 MED ORDER — SILDENAFIL CITRATE 100 MG PO TABS: 50.0000 mg | ORAL_TABLET | Freq: Every day | ORAL | 11 refills | Status: DC | PRN

## 2019-04-25 NOTE — Progress Notes (Signed)
[place] Telemedicine Visit  Patient consented to have virtual visit and was identified by name and date of birth. Method of visit: Telephone  Encounter participants: Patient: David Carrillo - located at home Provider: Lind Covert - located at office Others (if applicable): no  Chief Complaint: knee and thumb pain  HPI:  Had injury to knee a month ago and still tender  ROS: per HPI  Pertinent PMHx: DJD  Exam:  There were no vitals taken for this visit.  Respiratory: nl  Assessment/Plan:  Left knee pain Worsened .  Suggested he needs to come in for office visit for exam    Time spent during visit with patient: 10 minutes

## 2019-04-25 NOTE — Assessment & Plan Note (Signed)
Worsened .  Suggested he needs to come in for office visit for exam

## 2019-05-01 ENCOUNTER — Other Ambulatory Visit: Payer: Self-pay

## 2019-05-01 ENCOUNTER — Encounter: Payer: Self-pay | Admitting: Family Medicine

## 2019-05-01 ENCOUNTER — Ambulatory Visit (INDEPENDENT_AMBULATORY_CARE_PROVIDER_SITE_OTHER): Payer: BC Managed Care – PPO | Admitting: Family Medicine

## 2019-05-01 DIAGNOSIS — M79645 Pain in left finger(s): Secondary | ICD-10-CM | POA: Insufficient documentation

## 2019-05-01 DIAGNOSIS — I10 Essential (primary) hypertension: Secondary | ICD-10-CM

## 2019-05-01 DIAGNOSIS — R053 Chronic cough: Secondary | ICD-10-CM

## 2019-05-01 DIAGNOSIS — M25562 Pain in left knee: Secondary | ICD-10-CM

## 2019-05-01 DIAGNOSIS — R05 Cough: Secondary | ICD-10-CM | POA: Diagnosis not present

## 2019-05-01 NOTE — Patient Instructions (Addendum)
Good to see you today!  Thanks for coming in.  For the knee - can try ibuprofen 2-3 tabs as needed when really hurting If it starts to give out or swell up or lock up let me know  For the thumb will get an xray and I will let you know  Cramps - try to stay hydrated.  Let me know if gets a lot worse will check a blood test  Keep the blood pressure < 140/90  Come back in a year for a check up

## 2019-05-01 NOTE — Assessment & Plan Note (Signed)
Perhaps mild mensicus strain.  Since not limiting will observe

## 2019-05-01 NOTE — Assessment & Plan Note (Signed)
Stable.  CT in past negative and has not responded to usual chronic cough medications.  Suggested he follow up with pulmonary as needed

## 2019-05-01 NOTE — Assessment & Plan Note (Signed)
BP Readings from Last 3 Encounters:  05/01/19 (!) 142/88  02/23/18 (!) 136/92  02/14/18 98/68   Slightly above goal.  Continue current medications monitor at home

## 2019-05-01 NOTE — Progress Notes (Signed)
    SUBJECTIVE:   CHIEF COMPLAINT / HPI:   Left KNEE PAIN [place] out of truck a few weeks ago and has been hurting on and off since.  No swelling or redness or locking or giving way.  Worse at night and sometimes when he twists it.  Diclofenac gel not much help  LEFT THUMB PAIN At base of left thumb espeically when lifts heavy things.  No redness or soft tissue swelling.  No specific injury  COUGH Continue to have intermittent cough.  No weight loss or bleeding or shortness of breath with exertion or chest pain.  Has tried numerous brochodilators, allergy medications and antiacids without help.  Had appointment with Pulmonary but cancelled when covid hit and has not followed up.  Does not seem to be worsening  HYPERTENSION Taking his indapamide regularly.  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  No laxity.  Mld pain with meniscus testing.  No focal bony tenderness Left Thumb - tender at proximal metacarpal.  Good strength  Heart - Regular rate and rhythm.  No murmurs, gallops or rubs.    Lungs:  Normal respiratory effort, chest expands symmetrically. Lungs are clear to auscultation, no crackles or wheezes.   ASSESSMENT/PLAN:   Chronic cough Stable.  CT in past negative and has not responded to usual chronic cough medications.  Suggested he follow up with pulmonary as needed  HYPERTENSION, BENIGN ESSENTIAL BP Readings from Last 3 Encounters:  05/01/19 (!) 142/88  02/23/18 (!) 136/92  02/14/18 98/68   Slightly above goal.  Continue current medications monitor at home   Left knee pain Perhaps mild mensicus strain.  Since not limiting will observe   Pain of left thumb Very likely arthritis.  Will check xray.        Lind Covert, MD [place]

## 2019-05-01 NOTE — Assessment & Plan Note (Signed)
Very likely arthritis.  Will check xray.

## 2019-05-04 ENCOUNTER — Ambulatory Visit
Admission: RE | Admit: 2019-05-04 | Discharge: 2019-05-04 | Disposition: A | Discharge status: Home / Self Care | Payer: BC Managed Care – PPO | Source: Ambulatory Visit | Attending: Family Medicine | Admitting: Family Medicine

## 2019-05-04 ENCOUNTER — Other Ambulatory Visit: Payer: Self-pay

## 2019-05-04 DIAGNOSIS — M79645 Pain in left finger(s): Secondary | ICD-10-CM

## 2019-05-07 ENCOUNTER — Telehealth: Payer: Self-pay | Admitting: Family Medicine

## 2019-05-07 NOTE — Telephone Encounter (Signed)
Called Discussed DJD and to continue current treatments  Contact me if gets really bad  He is aware of metal fbs

## 2019-06-28 ENCOUNTER — Other Ambulatory Visit: Payer: Self-pay | Admitting: Family Medicine

## 2019-11-24 ENCOUNTER — Other Ambulatory Visit: Payer: Self-pay | Admitting: Family Medicine

## 2020-03-06 ENCOUNTER — Telehealth: Payer: Self-pay

## 2020-03-06 NOTE — Telephone Encounter (Signed)
Patients wife calls nurse line stating Indapamide is on backorder and he needs an alternative. I called the pharmacy and spoke with the pharmacist in regards to medication. Per pharmacist they have enough to give him a one month supply and the medication is not on backorder, just ordered. Therefore, should not be a problem in the future. The wife has been updated.

## 2020-03-11 ENCOUNTER — Encounter: Payer: Self-pay | Admitting: Family Medicine

## 2020-03-11 ENCOUNTER — Ambulatory Visit: Payer: BC Managed Care – PPO | Admitting: Family Medicine

## 2020-03-11 ENCOUNTER — Other Ambulatory Visit: Payer: Self-pay

## 2020-03-11 DIAGNOSIS — E785 Hyperlipidemia, unspecified: Secondary | ICD-10-CM | POA: Diagnosis not present

## 2020-03-11 DIAGNOSIS — R0602 Shortness of breath: Secondary | ICD-10-CM

## 2020-03-11 DIAGNOSIS — Z23 Encounter for immunization: Secondary | ICD-10-CM

## 2020-03-11 DIAGNOSIS — I10 Essential (primary) hypertension: Secondary | ICD-10-CM | POA: Diagnosis not present

## 2020-03-11 MED ORDER — ZOSTER VAC RECOMB ADJUVANTED 50 MCG/0.5ML IM SUSR: INTRAMUSCULAR | 1 refills | Status: DC

## 2020-03-11 NOTE — Progress Notes (Signed)
    SUBJECTIVE:   CHIEF COMPLAINT / HPI:   SHORTNESS OF BREATH  Has noticed over the last 6 months more shortness of breath with usually activities.  No chest pain or edema.  Smoked for 30 years stopped 2004.  No wheezing or weight loss. Also had an episode last month while sitting felt hot, tired and had transient vision changes. Resolved after got up and went to bed.  None since    Hypertension Taking medications daily.  Has home blood pressure cuff - usually well controlled in 130/70-80s  Cough Persistent unchanged  PERTINENT  PMH / PSH: Has cardiac evaluation by Dr Meda Coffee in 2014  OBJECTIVE:   BP 120/80   Pulse 96   Wt 199 lb (90.3 kg)   SpO2 98%   BMI 29.82 kg/m   Alert NAD Heart - Regular rate and rhythm.  No murmurs, gallops or rubs.    Lungs:  Normal respiratory effort, chest expands symmetrically. Lungs are clear to auscultation, no crackles or wheezes. Extremities:  No cyanosis, edema, or deformity noted with good range of motion of all major joints.     ASSESSMENT/PLAN:   Shortness of breath Also with one episode possibly consistent with an arrythmia.  Is at risk for CAD given age and smoking history.  Will send for cardiac evaluation (was negative in 2014) as first step as well as check labs.  Could be early COPD or deconditioning if cardiac evaluation is normal likely get PFTs.    HYPERTENSION, BENIGN ESSENTIAL BP Readings from Last 3 Encounters:  03/11/20 120/80  05/01/19 (!) 142/88  02/23/18 (!) 136/92   At goal today.  Continue current medications check labs   Hyperlipidemia Stable on statin.  Check labs today     Lind Covert, MD [place]

## 2020-03-11 NOTE — Assessment & Plan Note (Signed)
BP Readings from Last 3 Encounters:  03/11/20 120/80  05/01/19 (!) 142/88  02/23/18 (!) 136/92   At goal today.  Continue current medications check labs

## 2020-03-11 NOTE — Patient Instructions (Addendum)
Good to see you today - Thank you for coming in  Things we discussed today:  Shortness of breath and Lightheadness episode - will refer you to cardiology for evaluation.  If suddenly this gets worse and persists you should go to the ER  Get you shingles shot from your drug store  I will call you if your tests are not good.  Otherwise, I will send you a message on MyChart (if it is active) or a letter in the mail..  If you do not hear from me with in 2 weeks please call our office.    Please always bring your medication bottles  Come back to see me in 6 months

## 2020-03-11 NOTE — Assessment & Plan Note (Signed)
Also with one episode possibly consistent with an arrythmia.  Is at risk for CAD given age and smoking history.  Will send for cardiac evaluation (was negative in 2014) as first step as well as check labs.  Could be early COPD or deconditioning if cardiac evaluation is normal likely get PFTs.

## 2020-03-11 NOTE — Assessment & Plan Note (Signed)
Stable on statin.  Check labs today  ?

## 2020-03-12 ENCOUNTER — Encounter: Payer: Self-pay | Admitting: Family Medicine

## 2020-03-12 LAB — CBC
Hematocrit: 44.5 % (ref 37.5–51.0)
Hemoglobin: 15.8 g/dL (ref 13.0–17.7)
MCH: 34.3 pg — ABNORMAL HIGH (ref 26.6–33.0)
MCHC: 35.5 g/dL (ref 31.5–35.7)
MCV: 97 fL (ref 79–97)
Platelets: 198 10*3/uL (ref 150–450)
RBC: 4.61 x10E6/uL (ref 4.14–5.80)
RDW: 13.1 % (ref 11.6–15.4)
WBC: 6.1 10*3/uL (ref 3.4–10.8)

## 2020-03-12 LAB — CMP14+EGFR
ALT: 26 IU/L (ref 0–44)
AST: 32 IU/L (ref 0–40)
Albumin/Globulin Ratio: 1.5 (ref 1.2–2.2)
Albumin: 4.4 g/dL (ref 3.8–4.8)
Alkaline Phosphatase: 85 IU/L (ref 44–121)
BUN/Creatinine Ratio: 14 (ref 10–24)
BUN: 13 mg/dL (ref 8–27)
Bilirubin Total: 0.5 mg/dL (ref 0.0–1.2)
CO2: 19 mmol/L — ABNORMAL LOW (ref 20–29)
Calcium: 9.2 mg/dL (ref 8.6–10.2)
Chloride: 100 mmol/L (ref 96–106)
Creatinine, Ser: 0.92 mg/dL (ref 0.76–1.27)
Globulin, Total: 2.9 g/dL (ref 1.5–4.5)
Glucose: 91 mg/dL (ref 65–99)
Sodium: 140 mmol/L (ref 134–144)
Total Protein: 7.3 g/dL (ref 6.0–8.5)
eGFR: 95 mL/min/{1.73_m2} (ref >59)

## 2020-03-12 LAB — LIPID PANEL
Chol/HDL Ratio: 3 ratio (ref 0.0–5.0)
Cholesterol, Total: 127 mg/dL (ref 100–199)
HDL: 43 mg/dL (ref >39)
LDL Chol Calc (NIH): 60 mg/dL (ref 0–99)
Triglycerides: 136 mg/dL (ref 0–149)
VLDL Cholesterol Cal: 24 mg/dL (ref 5–40)

## 2020-03-30 NOTE — Progress Notes (Signed)
Cardiology Office Note:    Date:  04/02/2020   ID:  David Carrillo, DOB 03-01-1960, MRN [id number]  PCP:  David Covert, MD   [place]  Cardiologist:  No primary care provider on file.  Advanced Practice Provider:  No care team member to display Electrophysiologist:  None    Referring MD: David Carrillo, *    History of Present Illness:    David Carrillo is a 60 y.o. male with a hx of HTN and GERD who was referred by Dr. Erin Carrillo for further evaluation of DOE.  Patient seen by Dr. Erin Carrillo on 03/11/20. Note reviewed. Patient has been experiencing worsening SOB for the past couple of years. He used to be able to [employer] without taking breaks but has now having to take several breaks. Has occasional chest pressure during these episodes and this goes away with rest. Symptoms have been stable over the course of the past year.   Also states he is having episodes of lightheadedness both with exertion and when sitting down. Has been ongoing for the past year. No syncope but has pre-syncopal episodes. No palpitations. No LE edema, orthopnea, PND. Nothing seems to trigger the episodes or make them better. Last a couple of seconds before resolving. Not related to position. changes. Notably is on lozol and states he may not be as hydrated as he should be. Blood pressure 130-140s/80-90s at home.   Prior smoker. 30 year pack history. CT chest for lung cancer screening with calcification in LM, LAD, LCx, and RCA  Family history: Father with massive MI at 45; mother with MI at 30  Past Medical History:  Diagnosis Date  . GERD (gastroesophageal reflux disease)   . Hypertension    borderline-no meds  . Neuromuscular disorder ([place])    some numbness rt hand-7/13    Past Surgical History:  Procedure Laterality Date  . SHOULDER ARTHROSCOPY  3 years ago    Current Medications: Current Meds  Medication Sig  . atorvastatin (LIPITOR) 40 MG tablet TAKE  1 TABLET BY MOUTH EVERY DAY  . indapamide (LOZOL) 1.25 MG tablet TAKE 1 TABLET (1.25 MG TOTAL) BY MOUTH DAILY.  . metoprolol tartrate (LOPRESSOR) 100 MG tablet Take one tablet by mouth 2 hours prior to your CT  . valsartan (DIOVAN) 40 MG tablet Take 1 tablet (40 mg total) by mouth daily.     Allergies:   Patient has no known allergies.   Social History   Socioeconomic History  . Marital status: Married    Spouse name: Not on file  . Number of children: Not on file  . Years of education: Not on file  . Highest education level: Not on file  Occupational History  . Not on file  Tobacco Use  . Smoking status: Former Smoker    Quit date: 08/05/2002    Years since quitting: 17.6  . Smokeless tobacco: Never Used  Substance and Sexual Activity  . Alcohol use: No  . Drug use: No  . Sexual activity: Not on file  Other Topics Concern  . Not on file  Social History Narrative  . Not on file   Social Determinants of Health   Financial Resource Strain: Not on file  Food Insecurity: Not on file  Transportation Needs: Not on file  Physical Activity: Not on file  Stress: Not on file  Social Connections: Not on file     Family History: The patient's family history is negative for Colon cancer.  ROS:   Please see the history of present illness.    Review of Systems  Constitutional: Negative for chills and fever.  HENT: Negative for Carrillo loss and sore throat.   Eyes: Negative for blurred vision and redness.  Respiratory: Positive for shortness of breath.   Cardiovascular: Positive for chest pain. Negative for palpitations, orthopnea, claudication, leg swelling and PND.  Gastrointestinal: Negative for nausea and vomiting.  Genitourinary: Negative for dysuria and flank pain.  Musculoskeletal: Negative for falls.  Neurological: Positive for dizziness. Negative for loss of consciousness.  Endo/Heme/Allergies: Negative for polydipsia.  Psychiatric/Behavioral: Negative for substance  abuse.    EKGs/Labs/Other Studies Reviewed:    The following studies were reviewed today: CT chest 07/2017: FINDINGS: Cardiovascular: Heart size is normal. There is no significant pericardial fluid, thickening or pericardial calcification. There is aortic atherosclerosis, as well as atherosclerosis of the great vessels of the mediastinum and the coronary arteries, including calcified atherosclerotic plaque in the left main, left anterior descending, left circumflex and right coronary arteries.  Mediastinum/Nodes: No pathologically enlarged mediastinal or hilar lymph nodes. Please note that accurate exclusion of hilar adenopathy is limited on noncontrast CT scans. Esophagus is unremarkable in appearance. No axillary lymphadenopathy.  Lungs/Pleura: Tiny pulmonary nodules are noted in the lungs, largest of which is in the posterior aspect of the right lower lobe (axial image 148 of series 3), with a volume derived mean diameter 4.3 mm. No larger more suspicious appearing pulmonary nodules or masses are noted. No acute consolidative airspace disease. No pleural effusions. Mild diffuse bronchial wall thickening with mild centrilobular and paraseptal emphysema.  Upper Abdomen: Aortic atherosclerosis.  Musculoskeletal: There are no aggressive appearing lytic or blastic lesions noted in the visualized portions of the skeleton.  IMPRESSION: 1. Lung-RADS 2S, benign appearance or behavior. Continue annual screening with low-dose chest CT without contrast in 12 months. 2. The "S" modifier above refers to potentially clinically significant non lung cancer related findings. Specifically, there is aortic atherosclerosis, in addition to left main and 3 vessel coronary artery disease. Please note that although the presence of coronary artery calcium documents the presence of coronary artery disease, the severity of this disease and any potential stenosis cannot be assessed on this  non-gated CT examination. Assessment for potential risk factor modification, dietary therapy or pharmacologic therapy may be warranted, if clinically indicated. 3. Mild diffuse bronchial wall thickening with mild centrilobular and paraseptal emphysema; imaging findings suggestive of underlying COPD.  Aortic Atherosclerosis (ICD10-I70.0) and Emphysema (ICD10-J43.9).  EKG:  EKG is ordered today.  The ekg ordered today demonstrates NSR with HR 98  Recent Labs: 03/11/2020: ALT 26; BUN 13; Creatinine, Ser 0.92; Hemoglobin 15.8; Platelets 198; Potassium CANCELED; Sodium 140  Recent Lipid Panel    Component Value Date/Time   CHOL 127 03/11/2020 0902   TRIG 136 03/11/2020 0902   HDL 43 03/11/2020 0902   CHOLHDL 3.0 03/11/2020 0902   CHOLHDL 2.4 04/24/2014 0948   VLDL 18 04/24/2014 0948   LDLCALC 60 03/11/2020 0902   LDLDIRECT 51 05/14/2015 1202     Physical Exam:    VS:  BP 140/90   Pulse 98   Ht 5\' 9"  (1.753 m)   Wt 200 lb 6.4 oz (90.9 kg)   SpO2 95%   BMI 29.59 kg/m     Wt Readings from Last 3 Encounters:  04/02/20 200 lb 6.4 oz (90.9 kg)  03/11/20 199 lb (90.3 kg)  05/01/19 196 lb (88.9 kg)     GEN:  Well nourished, well developed in no acute distress HEENT: Normal NECK: No JVD; No carotid bruits CARDIAC: RRR, no murmurs, rubs, gallops RESPIRATORY:  Clear to auscultation without rales, wheezing or rhonchi  ABDOMEN: Soft, non-tender, non-distended MUSCULOSKELETAL:  No edema; No deformity  SKIN: Warm and dry NEUROLOGIC:  Alert and oriented x 3 PSYCHIATRIC:  Normal affect   ASSESSMENT:    1. Dyspnea on exertion   2. Precordial pain   3. Lightheadedness   4. Coronary artery disease of native artery of native heart with stable angina pectoris ([place])   5. Primary hypertension   6. Mixed hyperlipidemia    PLAN:    In order of problems listed above:  #DOE: #CAD with calcification on CT chest: Patient with dyspnea on exertion that has been ongoing for the past  couple of years. Specifically, he notes that he used to be able to mow the lawn in one sitting and now has to take several breaks due to SOB as well as occasional chest discomfort. Notably, CT chest with calcification in LM, LAD, RCA, and LCx. Nuclear stress in 2014 was negative for ischemia. Given risk factors and known CAD on CT chest, concern for underlying ischemic etiology.  -Check coronary CTA -Check TTE -Continue ASA and atorvastatin 40mg  daily  #Lightheadedness: Patient with brief episodes of lightheadedness that occur intermittently. Can happen both at rest and with exertion. Not related to position changes. No associated chest pressure, SOB, palpitations. Has felt pre-syncopal but no syncope. Notably is on a diuretic and states he may be dehydrated. Will pursue ischemic work-up as above and check TTE. -Ischemic work-up as above -Check TTE -If above work-up unrevealing, can consider zio patch -Maintain good hydration in case orthostasis contributing  #HTN: Elevated at home running 130-140s. -Start valsartan 40mg  daily -Check BMET next week -Continue lozo 1.25mg  daily  #HLD: -Continue lipitor 40mg  daily; goal LDL <70    Medication Adjustments/Labs and Tests Ordered: Current medicines are reviewed at length with the patient today.  Concerns regarding medicines are outlined above.  Orders Placed This Encounter  Procedures  . CT CORONARY MORPH W/CTA COR W/SCORE W/CA W/CM &/OR WO/CM  . CT CORONARY FRACTIONAL FLOW RESERVE DATA PREP  . CT CORONARY FRACTIONAL FLOW RESERVE FLUID ANALYSIS  . Basic metabolic panel  . EKG 12-Lead  . ECHOCARDIOGRAM COMPLETE   Meds ordered this encounter  Medications  . valsartan (DIOVAN) 40 MG tablet    Sig: Take 1 tablet (40 mg total) by mouth daily.    Dispense:  90 tablet    Refill:  3  . metoprolol tartrate (LOPRESSOR) 100 MG tablet    Sig: Take one tablet by mouth 2 hours prior to your CT    Dispense:  1 tablet    Refill:  0    Patient  Instructions   Medication Instructions:  1) START Valsartan 40mg  once daily  *If you need a refill on your cardiac medications before your next appointment, please call your pharmacy*   Lab Work: BMET in 1 week  If you have labs (blood work) drawn today and your tests are completely normal, you will receive your results only by: Marland Kitchen MyChart Message (if you have MyChart) OR . A paper copy in the mail If you have any lab test that is abnormal or we need to change your treatment, we will call you to review the results.   Testing/Procedures: Your physician has requested that you have an echocardiogram. Echocardiography is a painless test that uses sound  waves to create images of your heart. It provides your doctor with information about the size and shape of your heart and how well your heart's chambers and valves are working. This procedure takes approximately one hour. There are no restrictions for this procedure.  Your physician recommends that you have a Coronary CT performed.  See below for instructions.    Follow-Up: At [facility], you and your health needs are our priority.  As part of our continuing mission to provide you with exceptional heart care, we have created designated Provider Care Teams.  These Care Teams include your primary Cardiologist (physician) and Advanced Practice Providers (APPs -  Physician Assistants and Nurse Practitioners) who all work together to provide you with the care you need, when you need it.  We recommend signing up for the patient portal called "MyChart".  Sign up information is provided on this After Visit Summary.  MyChart is used to connect with patients for Virtual Visits (Telemedicine).  Patients are able to view lab/test results, encounter notes, upcoming appointments, etc.  Non-urgent messages can be sent to your provider as well.   To learn more about what you can do with MyChart, go to [email or web address].    Your next appointment:    3-4 month(s)  The format for your next appointment:   In Person  Provider:   You may see Dr. Gwyndolyn Kaufman or one of the following Advanced Practice Providers on your designated Care Team:    Richardson Dopp, PA-C  Vin [place], [place]    Other Instructions  Your cardiac CT will be scheduled at one of the below locations:   [facility] [address] [place], [place] [address] [phone]  [place] [address] [place], [place] [address] [phone]  If scheduled at [facility], please arrive at the [facility] main entrance (entrance A) of [facility] 30 minutes prior to test start time. Proceed to the [facility] Radiology Department (first floor) to check-in and test prep.  If scheduled at [facility], please arrive 15 mins early for check-in and test prep.  Please follow these instructions carefully (unless otherwise directed):  Hold all erectile dysfunction medications at least 3 days (72 hrs) prior to test.  On the Night Before the Test: . Be sure to Drink plenty of water. . Do not consume any caffeinated/decaffeinated beverages or chocolate 12 hours prior to your test. . Do not take any antihistamines 12 hours prior to your test.   On the Day of the Test: . Drink plenty of water until 1 hour prior to the test. . Do not eat any food 4 hours prior to the test. . You may take your regular medications prior to the test.  . Take metoprolol (Lopressor) two hours prior to test. . HOLD Furosemide/Hydrochlorothiazide morning of the test. . FEMALES- please wear underwire-free bra if available       After the Test: . Drink plenty of water. . After receiving IV contrast, you may experience a mild flushed feeling. This is normal. . On occasion, you may experience a mild rash up to 24 hours after the test. This is not dangerous. If this occurs, you can take  Benadryl 25 mg and increase your fluid intake. . If you experience trouble breathing, this can be serious. If it is severe call 911 IMMEDIATELY. If it is mild, please call our office. . If you take any of  these medications: Glipizide/Metformin, Avandament, Glucavance, please do not take 48 hours after completing test unless otherwise instructed.   Once we have confirmed authorization from your insurance company, we will call you to set up a date and time for your test. Based on how quickly your insurance processes prior authorizations requests, please allow up to 4 weeks to be contacted for scheduling your Cardiac CT appointment. Be advised that routine Cardiac CT appointments could be scheduled as many as 8 weeks after your provider has ordered it.  For non-scheduling related questions, please contact the cardiac imaging nurse navigator should you have any questions/concerns: Marchia Bond, Cardiac Imaging Nurse Navigator Gordy Clement, Cardiac Imaging Nurse Navigator [facility] Direct Office Dial: [phone]   For scheduling needs, including cancellations and rescheduling, please call [place], [phone].    Mediterranean Diet A Mediterranean diet refers to food and lifestyle choices that are based on the traditions of countries located on the [employer]. This way of eating has been shown to help prevent certain conditions and improve outcomes for people who have chronic diseases, like kidney disease and heart disease. What are tips for following this plan? Lifestyle  Cook and eat meals together with your family, when possible.  Drink enough fluid to keep your urine clear or pale yellow.  Be physically active every day. This includes: ? Aerobic exercise like running or swimming. ? Leisure activities like gardening, walking, or housework.  Get 7-8 hours of sleep each night.  If recommended by your health care provider, drink red wine in  moderation. This means 1 glass a day for nonpregnant women and 2 glasses a day for men. A glass of wine equals 5 oz (150 mL). Reading food labels  Check the serving size of packaged foods. For foods such as rice and pasta, the serving size refers to the amount of cooked product, not dry.  Check the total fat in packaged foods. Avoid foods that have saturated fat or trans fats.  Check the ingredients list for added sugars, such as corn syrup.   Shopping  At the grocery store, buy most of your food from the areas near the walls of the store. This includes: ? Fresh fruits and vegetables (produce). ? Grains, beans, nuts, and seeds. Some of these may be available in unpackaged forms or large amounts (in bulk). ? Fresh seafood. ? Poultry and eggs. ? Low-fat dairy products.  Buy whole ingredients instead of prepackaged foods.  Buy fresh fruits and vegetables in-season from local farmers markets.  Buy frozen fruits and vegetables in resealable bags.  If you do not have access to quality fresh seafood, buy precooked frozen shrimp or canned fish, such as tuna, salmon, or sardines.  Buy small amounts of raw or cooked vegetables, salads, or olives from the deli or salad bar at your store.  Stock your pantry so you always have certain foods on hand, such as olive oil, canned tuna, canned tomatoes, rice, pasta, and beans. Cooking  Cook foods with extra-virgin olive oil instead of using butter or other vegetable oils.  Have meat as a side dish, and have vegetables or grains as your main dish. This means having meat in small portions or adding small amounts of meat to foods like pasta or stew.  Use beans or vegetables instead of meat in common dishes like chili or lasagna.  Experiment with different cooking methods. Try roasting or broiling vegetables instead of steaming or sauteing them.  Add frozen vegetables to  soups, stews, pasta, or rice.  Add nuts or seeds for added healthy fat at each  meal. You can add these to yogurt, salads, or vegetable dishes.  Marinate fish or vegetables using olive oil, lemon juice, garlic, and fresh herbs. Meal planning  Plan to eat 1 vegetarian meal one day each week. Try to work up to 2 vegetarian meals, if possible.  Eat seafood 2 or more times a week.  Have healthy snacks readily available, such as: ? Vegetable sticks with hummus. ? [place] yogurt. ? Fruit and nut trail mix.  Eat balanced meals throughout the week. This includes: ? Fruit: 2-3 servings a day ? Vegetables: 4-5 servings a day ? Low-fat dairy: 2 servings a day ? Fish, poultry, or lean meat: 1 serving a day ? Beans and legumes: 2 or more servings a week ? Nuts and seeds: 1-2 servings a day ? Whole grains: 6-8 servings a day ? Extra-virgin olive oil: 3-4 servings a day  Limit red meat and sweets to only a few servings a month   What are my food choices?  Mediterranean diet ? Recommended  Grains: Whole-grain pasta. Brown rice. Bulgar wheat. Polenta. Couscous. Whole-wheat bread. [place].  Vegetables: Artichokes. Beets. Broccoli. Cabbage. Carrots. Eggplant. Green beans. Chard. Kale. Spinach. Onions. Leeks. Peas. Squash. Tomatoes. Peppers. Radishes.  Fruits: Apples. Apricots. Avocado. Berries. Bananas. Cherries. Dates. Figs. Grapes. Lemons. Melon. Oranges. Peaches. Plums. Pomegranate.  Meats and other protein foods: Beans. Almonds. Sunflower seeds. Pine nuts. Peanuts. [place]. Salmon. Scallops. Shrimp. [place]. Tilapia. Clams. Oysters. Eggs.  Dairy: Low-fat milk. Cheese. Greek yogurt.  Beverages: Water. Red wine. Herbal tea.  Fats and oils: Extra virgin olive oil. Avocado oil. Grape seed oil.  Sweets and desserts: [place] yogurt with honey. Baked apples. Poached pears. Trail mix.  Seasoning and other foods: Basil. Cilantro. Coriander. Cumin. Mint. Parsley. Sage. Rosemary. Tarragon. Garlic. Oregano. Thyme. Pepper. Balsalmic vinegar. Tahini. Hummus. Tomato sauce. Olives.  Mushrooms. ? Limit these  Grains: Prepackaged pasta or rice dishes. Prepackaged cereal with added sugar.  Vegetables: Deep fried potatoes (french fries).  Fruits: Fruit canned in syrup.  Meats and other protein foods: Beef. Pork. Lamb. Poultry with skin. Hot dogs. Berniece Salines.  Dairy: Ice cream. Sour cream. Whole milk.  Beverages: Juice. Sugar-sweetened soft drinks. Beer. Liquor and spirits.  Fats and oils: Butter. Canola oil. Vegetable oil. Beef fat (tallow). Lard.  Sweets and desserts: Cookies. Cakes. Pies. Candy.  Seasoning and other foods: Mayonnaise. Premade sauces and marinades. The items listed may not be a complete list. Talk with your dietitian about what dietary choices are right for you. Summary  The Mediterranean diet includes both food and lifestyle choices.  Eat a variety of fresh fruits and vegetables, beans, nuts, seeds, and whole grains.  Limit the amount of red meat and sweets that you eat.  Talk with your health care provider about whether it is safe for you to drink red wine in moderation. This means 1 glass a day for nonpregnant women and 2 glasses a day for men. A glass of wine equals 5 oz (150 mL). This information is not intended to replace advice given to you by your health care provider. Make sure you discuss any questions you have with your health care provider. Document Revised: 08/28/2015 Document Reviewed: 08/21/2015 Elsevier Patient Education  2020 [place], Freada Bergeron, MD  04/02/2020 10:49 AM    [place]

## 2020-04-02 ENCOUNTER — Encounter: Payer: Self-pay | Admitting: Cardiology

## 2020-04-02 ENCOUNTER — Other Ambulatory Visit: Payer: Self-pay

## 2020-04-02 ENCOUNTER — Ambulatory Visit: Payer: BC Managed Care – PPO | Admitting: Cardiology

## 2020-04-02 VITALS — BP 140/90 | HR 98 | Ht 69.0 in | Wt 200.4 lb

## 2020-04-02 DIAGNOSIS — I25118 Atherosclerotic heart disease of native coronary artery with other forms of angina pectoris: Secondary | ICD-10-CM

## 2020-04-02 DIAGNOSIS — R42 Dizziness and giddiness: Secondary | ICD-10-CM

## 2020-04-02 DIAGNOSIS — I10 Essential (primary) hypertension: Secondary | ICD-10-CM

## 2020-04-02 DIAGNOSIS — R0600 Dyspnea, unspecified: Secondary | ICD-10-CM | POA: Diagnosis not present

## 2020-04-02 DIAGNOSIS — E782 Mixed hyperlipidemia: Secondary | ICD-10-CM

## 2020-04-02 DIAGNOSIS — R072 Precordial pain: Secondary | ICD-10-CM | POA: Diagnosis not present

## 2020-04-02 DIAGNOSIS — R0609 Other forms of dyspnea: Secondary | ICD-10-CM

## 2020-04-02 MED ORDER — METOPROLOL TARTRATE 100 MG PO TABS: ORAL_TABLET | ORAL | 0 refills | Status: DC

## 2020-04-02 MED ORDER — VALSARTAN 40 MG PO TABS: 40.0000 mg | ORAL_TABLET | Freq: Every day | ORAL | 3 refills | Status: DC

## 2020-04-02 NOTE — Patient Instructions (Addendum)
Medication Instructions:  1) START Valsartan 40mg  once daily  *If you need a refill on your cardiac medications before your next appointment, please call your pharmacy*   Lab Work: BMET in 1 week  If you have labs (blood work) drawn today and your tests are completely normal, you will receive your results only by: Marland Kitchen MyChart Message (if you have MyChart) OR . A paper copy in the mail If you have any lab test that is abnormal or we need to change your treatment, we will call you to review the results.   Testing/Procedures: Your physician has requested that you have an echocardiogram. Echocardiography is a painless test that uses sound waves to create images of your heart. It provides your doctor with information about the size and shape of your heart and how well your heart's chambers and valves are working. This procedure takes approximately one hour. There are no restrictions for this procedure.  Your physician recommends that you have a Coronary CT performed.  See below for instructions.    Follow-Up: At [facility], you and your health needs are our priority.  As part of our continuing mission to provide you with exceptional heart care, we have created designated Provider Care Teams.  These Care Teams include your primary Cardiologist (physician) and Advanced Practice Providers (APPs -  Physician Assistants and Nurse Practitioners) who all work together to provide you with the care you need, when you need it.  We recommend signing up for the patient portal called "MyChart".  Sign up information is provided on this After Visit Summary.  MyChart is used to connect with patients for Virtual Visits (Telemedicine).  Patients are able to view lab/test results, encounter notes, upcoming appointments, etc.  Non-urgent messages can be sent to your provider as well.   To learn more about what you can do with MyChart, go to [email or web address].    Your next appointment:   3-4  month(s)  The format for your next appointment:   In Person  Provider:   You may see Dr. Gwyndolyn Kaufman or one of the following Advanced Practice Providers on your designated Care Team:    Richardson Dopp, PA-C  [place]in [place], [place]    Other Instructions  Your cardiac CT will be scheduled at one of the below locations:   [facility] [address] [place], [place] [address] [phone]  [facility] [address] [place], [place] [address] [phone]  If scheduled at [facility], please arrive at the [facility] main entrance (entrance A) of [facility] 30 minutes prior to test start time. Proceed to the [facility] Radiology Department (first floor) to check-in and test prep.  If scheduled at [facility], please arrive 15 mins early for check-in and test prep.  Please follow these instructions carefully (unless otherwise directed):  Hold all erectile dysfunction medications at least 3 days (72 hrs) prior to test.  On the Night Before the Test: . Be sure to Drink plenty of water. . Do not consume any caffeinated/decaffeinated beverages or chocolate 12 hours prior to your test. . Do not take any antihistamines 12 hours prior to your test.   On the Day of the Test: . Drink plenty of water until 1 hour prior to the test. . Do not eat any food 4 hours prior to the test. . You may take your regular medications prior to the test.  . Take metoprolol (  Lopressor) two hours prior to test. . HOLD Furosemide/Hydrochlorothiazide morning of the test. . FEMALES- please wear underwire-free bra if available       After the Test: . Drink plenty of water. . After receiving IV contrast, you may experience a mild flushed feeling. This is normal. . On occasion, you may experience a mild rash up to 24 hours after the test. This is not dangerous. If this occurs, you can take  Benadryl 25 mg and increase your fluid intake. . If you experience trouble breathing, this can be serious. If it is severe call 911 IMMEDIATELY. If it is mild, please call our office. . If you take any of these medications: Glipizide/Metformin, Avandament, Glucavance, please do not take 48 hours after completing test unless otherwise instructed.   Once we have confirmed authorization from your insurance company, we will call you to set up a date and time for your test. Based on how quickly your insurance processes prior authorizations requests, please allow up to 4 weeks to be contacted for scheduling your Cardiac CT appointment. Be advised that routine Cardiac CT appointments could be scheduled as many as 8 weeks after your provider has ordered it.  For non-scheduling related questions, please contact the cardiac imaging nurse navigator should you have any questions/concerns: Marchia Bond, Cardiac Imaging Nurse Navigator Gordy Clement, Cardiac Imaging Nurse Navigator [facility] Direct Office Dial: [phone]   For scheduling needs, including cancellations and rescheduling, please call [place], [phone].    Mediterranean Diet A Mediterranean diet refers to food and lifestyle choices that are based on the traditions of countries located on the [employer]. This way of eating has been shown to help prevent certain conditions and improve outcomes for people who have chronic diseases, like kidney disease and heart disease. What are tips for following this plan? Lifestyle  Cook and eat meals together with your family, when possible.  Drink enough fluid to keep your urine clear or pale yellow.  Be physically active every day. This includes: ? Aerobic exercise like running or swimming. ? Leisure activities like gardening, walking, or housework.  Get 7-8 hours of sleep each night.  If recommended by your health care provider, drink red wine in  moderation. This means 1 glass a day for nonpregnant women and 2 glasses a day for men. A glass of wine equals 5 oz (150 mL). Reading food labels  Check the serving size of packaged foods. For foods such as rice and pasta, the serving size refers to the amount of cooked product, not dry.  Check the total fat in packaged foods. Avoid foods that have saturated fat or trans fats.  Check the ingredients list for added sugars, such as corn syrup.   Shopping  At the grocery store, buy most of your food from the areas near the walls of the store. This includes: ? Fresh fruits and vegetables (produce). ? Grains, beans, nuts, and seeds. Some of these may be available in unpackaged forms or large amounts (in bulk). ? Fresh seafood. ? Poultry and eggs. ? Low-fat dairy products.  Buy whole ingredients instead of prepackaged foods.  Buy fresh fruits and vegetables in-season from local farmers markets.  Buy frozen fruits and vegetables in resealable bags.  If you do not have access to quality fresh seafood, buy precooked frozen shrimp or canned fish, such as tuna, salmon, or sardines.  Buy small amounts of raw or cooked vegetables, salads, or olives from the deli or salad  bar at your store.  Stock your pantry so you always have certain foods on hand, such as olive oil, canned tuna, canned tomatoes, rice, pasta, and beans. Cooking  Cook foods with extra-virgin olive oil instead of using butter or other vegetable oils.  Have meat as a side dish, and have vegetables or grains as your main dish. This means having meat in small portions or adding small amounts of meat to foods like pasta or stew.  Use beans or vegetables instead of meat in common dishes like chili or lasagna.  Experiment with different cooking methods. Try roasting or broiling vegetables instead of steaming or sauteing them.  Add frozen vegetables to soups, stews, pasta, or rice.  Add nuts or seeds for added healthy fat at each  meal. You can add these to yogurt, salads, or vegetable dishes.  Marinate fish or vegetables using olive oil, lemon juice, garlic, and fresh herbs. Meal planning  Plan to eat 1 vegetarian meal one day each week. Try to work up to 2 vegetarian meals, if possible.  Eat seafood 2 or more times a week.  Have healthy snacks readily available, such as: ? Vegetable sticks with hummus. ? [place] yogurt. ? Fruit and nut trail mix.  Eat balanced meals throughout the week. This includes: ? Fruit: 2-3 servings a day ? Vegetables: 4-5 servings a day ? Low-fat dairy: 2 servings a day ? Fish, poultry, or lean meat: 1 serving a day ? Beans and legumes: 2 or more servings a week ? Nuts and seeds: 1-2 servings a day ? Whole grains: 6-8 servings a day ? Extra-virgin olive oil: 3-4 servings a day  Limit red meat and sweets to only a few servings a month   What are my food choices?  Mediterranean diet ? Recommended  Grains: Whole-grain pasta. Brown rice. Bulgar wheat. Polenta. Couscous. Whole-wheat bread. [place].  Vegetables: Artichokes. Beets. Broccoli. Cabbage. Carrots. Eggplant. Green beans. Chard. Kale. Spinach. Onions. Leeks. Peas. Squash. Tomatoes. Peppers. Radishes.  Fruits: Apples. Apricots. Avocado. Berries. Bananas. Cherries. Dates. Figs. Grapes. Lemons. Melon. Oranges. Peaches. Plums. Pomegranate.  Meats and other protein foods: Beans. Almonds. Sunflower seeds. Pine nuts. Peanuts. [place]. Salmon. Scallops. Shrimp. [place]. Tilapia. Clams. Oysters. Eggs.  Dairy: Low-fat milk. Cheese. Greek yogurt.  Beverages: Water. Red wine. Herbal tea.  Fats and oils: Extra virgin olive oil. Avocado oil. Grape seed oil.  Sweets and desserts: [place] yogurt with honey. Baked apples. Poached pears. Trail mix.  Seasoning and other foods: Basil. Cilantro. Coriander. Cumin. Mint. Parsley. Sage. Rosemary. Tarragon. Garlic. Oregano. Thyme. Pepper. Balsalmic vinegar. Tahini. Hummus. Tomato sauce. Olives.  Mushrooms. ? Limit these  Grains: Prepackaged pasta or rice dishes. Prepackaged cereal with added sugar.  Vegetables: Deep fried potatoes (french fries).  Fruits: Fruit canned in syrup.  Meats and other protein foods: Beef. Pork. Lamb. Poultry with skin. Hot dogs. Berniece Salines.  Dairy: Ice cream. Sour cream. Whole milk.  Beverages: Juice. Sugar-sweetened soft drinks. Beer. Liquor and spirits.  Fats and oils: Butter. Canola oil. Vegetable oil. Beef fat (tallow). Lard.  Sweets and desserts: Cookies. Cakes. Pies. Candy.  Seasoning and other foods: Mayonnaise. Premade sauces and marinades. The items listed may not be a complete list. Talk with your dietitian about what dietary choices are right for you. Summary  The Mediterranean diet includes both food and lifestyle choices.  Eat a variety of fresh fruits and vegetables, beans, nuts, seeds, and whole grains.  Limit the amount of red meat and sweets that you eat.  Talk with your health care provider about whether it is safe for you to drink red wine in moderation. This means 1 glass a day for nonpregnant women and 2 glasses a day for men. A glass of wine equals 5 oz (150 mL). This information is not intended to replace advice given to you by your health care provider. Make sure you discuss any questions you have with your health care provider. Document Revised: 08/28/2015 Document Reviewed: 08/21/2015 Elsevier Patient Education  [place].

## 2020-04-09 ENCOUNTER — Other Ambulatory Visit: Payer: Self-pay

## 2020-04-09 ENCOUNTER — Other Ambulatory Visit: Payer: BC Managed Care – PPO | Admitting: *Deleted

## 2020-04-09 DIAGNOSIS — R072 Precordial pain: Secondary | ICD-10-CM

## 2020-04-09 LAB — BASIC METABOLIC PANEL
BUN/Creatinine Ratio: 17 (ref 10–24)
BUN: 17 mg/dL (ref 8–27)
CO2: 25 mmol/L (ref 20–29)
Calcium: 9.5 mg/dL (ref 8.6–10.2)
Chloride: 99 mmol/L (ref 96–106)
Creatinine, Ser: 1.02 mg/dL (ref 0.76–1.27)
Glucose: 69 mg/dL (ref 65–99)
Potassium: 4.4 mmol/L (ref 3.5–5.2)
Sodium: 141 mmol/L (ref 134–144)
eGFR: 84 mL/min/{1.73_m2} (ref >59)

## 2020-04-24 ENCOUNTER — Other Ambulatory Visit: Payer: Self-pay | Admitting: Family Medicine

## 2020-05-01 ENCOUNTER — Encounter (INDEPENDENT_AMBULATORY_CARE_PROVIDER_SITE_OTHER): Payer: Self-pay

## 2020-05-01 ENCOUNTER — Other Ambulatory Visit: Payer: Self-pay

## 2020-05-01 ENCOUNTER — Ambulatory Visit (HOSPITAL_COMMUNITY): Payer: BC Managed Care – PPO | Attending: Cardiology

## 2020-05-01 ENCOUNTER — Telehealth (HOSPITAL_COMMUNITY): Payer: Self-pay | Admitting: *Deleted

## 2020-05-01 DIAGNOSIS — R072 Precordial pain: Secondary | ICD-10-CM

## 2020-05-01 LAB — ECHOCARDIOGRAM COMPLETE
Area-P 1/2: 4.96 cm2
P 1/2 time: 451 msec
S' Lateral: 2.9 cm

## 2020-05-01 NOTE — Telephone Encounter (Signed)
Attempted to call patient regarding upcoming cardiac CT appointment. °Left message on voicemail with name and callback number ° °Derian Pfost RN Navigator Cardiac Imaging °[facility] °[phone] Office °[phone] Cell ° °

## 2020-05-02 ENCOUNTER — Telehealth (HOSPITAL_COMMUNITY): Payer: Self-pay | Admitting: *Deleted

## 2020-05-02 NOTE — Telephone Encounter (Signed)
Reaching out to patient to offer assistance regarding upcoming cardiac imaging study; pt verbalizes understanding of appt date/time, parking situation and where to check in, pre-test NPO status and medications ordered, and verified current allergies; name and call back number provided for further questions should they arise  Dijuan Sleeth RN Navigator Cardiac Imaging [facility] [phone] office [phone] cell  Pt to take 100mg metoprolol tartrate 2 hours prior to cardiac CT scan. 

## 2020-05-05 ENCOUNTER — Other Ambulatory Visit: Payer: Self-pay

## 2020-05-05 ENCOUNTER — Ambulatory Visit (HOSPITAL_COMMUNITY)
Admission: RE | Admit: 2020-05-05 | Discharge: 2020-05-05 | Disposition: A | Discharge status: Home / Self Care | Payer: BC Managed Care – PPO | Source: Ambulatory Visit | Attending: Cardiology | Admitting: Cardiology

## 2020-05-05 DIAGNOSIS — Z006 Encounter for examination for normal comparison and control in clinical research program: Secondary | ICD-10-CM

## 2020-05-05 DIAGNOSIS — R072 Precordial pain: Secondary | ICD-10-CM

## 2020-05-05 DIAGNOSIS — R931 Abnormal findings on diagnostic imaging of heart and coronary circulation: Secondary | ICD-10-CM | POA: Diagnosis not present

## 2020-05-05 MED ORDER — IOHEXOL 350 MG/ML SOLN
95.0000 mL | Freq: Once | INTRAVENOUS | Status: AC | PRN
  Administered 2020-05-05: 95 mL via INTRAVENOUS

## 2020-05-05 MED ORDER — NITROGLYCERIN 0.4 MG SL SUBL
0.8000 mg | SUBLINGUAL_TABLET | Freq: Once | SUBLINGUAL | Status: AC
  Administered 2020-05-05: 0.8 mg via SUBLINGUAL

## 2020-05-05 MED ORDER — NITROGLYCERIN 0.4 MG SL SUBL
SUBLINGUAL_TABLET | SUBLINGUAL | Status: AC
  Filled 2020-05-05: qty 2

## 2020-05-05 NOTE — Research (Signed)
IDENTIFY Informed Consent                  Subject Name: David Carrillo   Subject met inclusion and exclusion criteria.  The informed consent form, study requirements and expectations were reviewed with the subject and questions and concerns were addressed prior to the signing of the consent form.  The subject verbalized understanding of the trial requirements.  The subject agreed to participate in the IDENTIFY trial and signed the informed consent at 09:28AM on 05/05/20.  The informed consent was obtained prior to performance of any protocol-specific procedures for the subject.  A copy of the signed informed consent was given to the subject and a copy was placed in the subject's medical record.   Sallye Ober , Research Assistant

## 2020-05-06 ENCOUNTER — Telehealth: Payer: Self-pay | Admitting: *Deleted

## 2020-05-06 MED ORDER — ASPIRIN EC 81 MG PO TBEC: 81.0000 mg | DELAYED_RELEASE_TABLET | Freq: Every day | ORAL | 3 refills | Status: AC

## 2020-05-06 NOTE — Telephone Encounter (Signed)
The patient has been notified of the result and verbalized understanding.  All questions (if any) were answered. Nuala Alpha, LPN 0/31/5945 85:92 PM   Pt aware to continue his current statin regimen, and start taking ASA 81 mg po daily. Pt verbalized understanding and agrees with this plan.

## 2020-05-06 NOTE — Telephone Encounter (Signed)
-----   Message from Sueanne Margarita, MD sent at 05/06/2020 11:22 AM EDT ----- Coronary CTA showed elevated coronary Ca score of 560 and mild plaque in the pLAD and pLCC with 25-49% stenosis and non obstructive with normal FFR.  LDL was 60 last month.  Continue statin.  Start ASA 81mg  daily.

## 2020-07-03 ENCOUNTER — Other Ambulatory Visit: Payer: Self-pay | Admitting: Family Medicine

## 2020-07-06 NOTE — Progress Notes (Deleted)
Cardiology Office Note:    Date:  07/06/2020   ID:  David Carrillo, DOB [birth year], MRN [id number]  PCP:  Lind Covert, MD   [place]  Cardiologist:  None  Advanced Practice Provider:  No care team member to display Electrophysiologist:  None    Referring MD: Lind Covert, *    History of Present Illness:    David Carrillo is a 60 y.o. male with a hx of HTN and GERD who presents to clinic for follow-up of DOE.  Patient initially seen on 04/02/20 for dyspnea on exertion. TTE 05-29-20 with LVEF 60-65%, G1DD, no significant valve disease. Coronary CTA on 05/05/20 with mild plaque in the prox LAD and prox Lcx. Ca score 560 (91% for age-gender matched control).   Today,   Prior smoker. 30 year pack history. CT chest for lung cancer screening with calcification in LM, LAD, LCx, and RCA  Family history: Father with massive MI at 17; mother with MI at 45  Past Medical History:  Diagnosis Date   GERD (gastroesophageal reflux disease)    Hypertension    borderline-no meds   Neuromuscular disorder ([place])    some numbness rt hand-7/13    Past Surgical History:  Procedure Laterality Date   SHOULDER ARTHROSCOPY  3 years ago    Current Medications: No outpatient medications have been marked as taking for the 07/08/20 encounter (Appointment) with Freada Bergeron, MD.     Allergies:   Patient has no known allergies.   Social History   Socioeconomic History   Marital status: Married    Spouse name: Not on file   Number of children: Not on file   Years of education: Not on file   Highest education level: Not on file  Occupational History   Not on file  Tobacco Use   Smoking status: Former    Pack years: 0.00    Types: Cigarettes    Quit date: 08/05/2002    Years since quitting: 17.9   Smokeless tobacco: Never  Substance and Sexual Activity   Alcohol use: No   Drug use: No   Sexual activity: Not on file  Other Topics  Concern   Not on file  Social History Narrative   Not on file   Social Determinants of Health   Financial Resource Strain: Not on file  Food Insecurity: Not on file  Transportation Needs: Not on file  Physical Activity: Not on file  Stress: Not on file  Social Connections: Not on file     Family History: The patient's family history is negative for Colon cancer.  ROS:   Please see the history of present illness.    Review of Systems  Constitutional:  Negative for chills and fever.  HENT:  Negative for hearing loss and sore throat.   Eyes:  Negative for blurred vision and redness.  Respiratory:  Positive for shortness of breath.   Cardiovascular:  Positive for chest pain. Negative for palpitations, orthopnea, claudication, leg swelling and PND.  Gastrointestinal:  Negative for nausea and vomiting.  Genitourinary:  Negative for dysuria and flank pain.  Musculoskeletal:  Negative for falls.  Neurological:  Positive for dizziness. Negative for loss of consciousness.  Endo/Heme/Allergies:  Negative for polydipsia.  Psychiatric/Behavioral:  Negative for substance abuse.    EKGs/Labs/Other Studies Reviewed:    The following studies were reviewed today: TTE 29-May-2020: IMPRESSIONS     1. Left ventricular ejection fraction, by estimation, is 60 to 65%. The  left ventricle has normal function. The left ventricle has no regional  wall motion abnormalities. Left ventricular diastolic parameters are  consistent with Grade I diastolic  dysfunction (impaired relaxation).   2. Right ventricular systolic function is normal. The right ventricular  size is normal. There is normal pulmonary artery systolic pressure. The  estimated right ventricular systolic pressure is 19.1 mmHg.   3. The mitral valve is normal in structure. Trivial mitral valve  regurgitation. No evidence of mitral stenosis.   4. The aortic valve is tricuspid. There is mild calcification of the  aortic valve. There is  mild thickening of the aortic valve. Aortic valve  regurgitation is trivial. Mild aortic valve sclerosis is present, with no  evidence of aortic valve stenosis.   5. The inferior vena cava is normal in size with greater than 50%  respiratory variability, suggesting right atrial pressure of 3 mmHg.   Coronary CTA 05/05/20: FINDINGS: Image quality: Excellent.   Noise artifact is: Limited.   Coronary Arteries:  Normal coronary origin.  Right dominance.   Left main: The left main is a large caliber vessel with a normal take off from the left coronary cusp that trifurcates into a LAD, LCX, and ramus intermedius. There is minimal calcified plaque (<25%).   Left anterior descending artery: There is a dual LAD system with the smaller vessel medially. The medial LAD contains no plaque. The lateral vessel that supplies the apex contains mild calcified plaque in the proximal segment (25-49%). The mid segment contains minimal calcified plaque (<25%). The distal segment is patent. The LAD gives off 1 patent diagonal branch.   Ramus intermedius: Patent with no evidence of plaque or stenosis.   Left circumflex artery: The LCX is non-dominant. The proximal LCX contains mild calcified plaque (25-49%). The LCX gives off 2 patent obtuse marginal branches.   Right coronary artery: The RCA is dominant with normal take off from the right coronary cusp. The proximal RCA contains minimal calcified plaque (<25%). The mid and distal segments are patent. The RCA terminates as a PDA and right posterolateral branch without evidence of plaque or stenosis.   Right Atrium: Right atrial size is within normal limits.   Right Ventricle: The right ventricular cavity is within normal limits.   Left Atrium: Left atrial size is normal in size with no left atrial appendage filling defect.   Left Ventricle: The ventricular cavity size is within normal limits. There are no stigmata of prior infarction. There is  no abnormal filling defect.   Pulmonary arteries: Normal in size without proximal filling defect.   Pulmonary veins: Normal pulmonary venous drainage.   Pericardium: Normal thickness with no significant effusion or calcium present.   Cardiac valves: The aortic valve is trileaflet without significant calcification. The mitral valve is normal structure without significant calcification.   Aorta: Normal caliber with no significant disease.   Extra-cardiac findings: See attached radiology report for non-cardiac structures.   IMPRESSION: 1. Coronary calcium score of 560. This was 91st percentile for age-, sex, and race-matched controls.   2. Normal coronary origin with right dominance.   3. Mild calcified plaque (25-49%) in the proximal LAD and proximal LCX.   RECOMMENDATIONS: 1. Mild non-obstructive CAD (25-49%). Consider non-atherosclerotic causes of chest pain. Consider preventive therapy and risk factor modification. CT FFR will be sent due to proximal location or calcified plaque.  CT chest 07/2017: FINDINGS: Cardiovascular: Heart size is normal. There is no significant pericardial fluid, thickening or pericardial calcification. There is  aortic atherosclerosis, as well as atherosclerosis of the great vessels of the mediastinum and the coronary arteries, including calcified atherosclerotic plaque in the left main, left anterior descending, left circumflex and right coronary arteries.   Mediastinum/Nodes: No pathologically enlarged mediastinal or hilar lymph nodes. Please note that accurate exclusion of hilar adenopathy is limited on noncontrast CT scans. Esophagus is unremarkable in appearance. No axillary lymphadenopathy.   Lungs/Pleura: Tiny pulmonary nodules are noted in the lungs, largest of which is in the posterior aspect of the right lower lobe (axial image 148 of series 3), with a volume derived mean diameter 4.3 mm. No larger more suspicious appearing  pulmonary nodules or masses are noted. No acute consolidative airspace disease. No pleural effusions. Mild diffuse bronchial wall thickening with mild centrilobular and paraseptal emphysema.   Upper Abdomen: Aortic atherosclerosis.   Musculoskeletal: There are no aggressive appearing lytic or blastic lesions noted in the visualized portions of the skeleton.   IMPRESSION: 1. Lung-RADS 2S, benign appearance or behavior. Continue annual screening with low-dose chest CT without contrast in 12 months. 2. The "S" modifier above refers to potentially clinically significant non lung cancer related findings. Specifically, there is aortic atherosclerosis, in addition to left main and 3 vessel coronary artery disease. Please note that although the presence of coronary artery calcium documents the presence of coronary artery disease, the severity of this disease and any potential stenosis cannot be assessed on this non-gated CT examination. Assessment for potential risk factor modification, dietary therapy or pharmacologic therapy may be warranted, if clinically indicated. 3. Mild diffuse bronchial wall thickening with mild centrilobular and paraseptal emphysema; imaging findings suggestive of underlying COPD.   Aortic Atherosclerosis (ICD10-I70.0) and Emphysema (ICD10-J43.9).  EKG:  EKG is ordered today.  The ekg ordered today demonstrates NSR with HR 98  Recent Labs: 03/11/2020: ALT 26; Hemoglobin 15.8; Platelets 198 04/09/2020: BUN 17; Creatinine, Ser 1.02; Potassium 4.4; Sodium 141  Recent Lipid Panel    Component Value Date/Time   CHOL 127 03/11/2020 0902   TRIG 136 03/11/2020 0902   HDL 43 03/11/2020 0902   CHOLHDL 3.0 03/11/2020 0902   CHOLHDL 2.4 04/24/2014 0948   VLDL 18 04/24/2014 0948   LDLCALC 60 03/11/2020 0902   LDLDIRECT 51 05/14/2015 1202     Physical Exam:    VS:  There were no vitals taken for this visit.    Wt Readings from Last 3 Encounters:  04/02/20 200 lb  6.4 oz (90.9 kg)  03/11/20 199 lb (90.3 kg)  05/01/19 196 lb (88.9 kg)     GEN:  Well nourished, well developed in no acute distress HEENT: Normal NECK: No JVD; No carotid bruits CARDIAC: RRR, no murmurs, rubs, gallops RESPIRATORY:  Clear to auscultation without rales, wheezing or rhonchi  ABDOMEN: Soft, non-tender, non-distended MUSCULOSKELETAL:  No edema; No deformity  SKIN: Warm and dry NEUROLOGIC:  Alert and oriented x 3 PSYCHIATRIC:  Normal affect   ASSESSMENT:    No diagnosis found.  PLAN:    In order of problems listed above:  #DOE: #Mild nonobstructive CAD: Coronary CTA with mild, nonobstructive CAD in LAD and Lcx. Ca score 560 (91% for age, race, sex control). TTE with normal LVEF, no significant valve disease. Will continue with aggressive medical management of nonobstructive CAD. -Continue ASA 81mg  daily and atorvastatin 40mg  daily -Continue lifestyle modifications with healthy diet and exercise  #Lightheadedness: Improved. Ischemic work-up reassuring. TTE with normal LVEF, no significant valve disease.  #HTN: -Continue valsartan 40mg  daily -Continue lozo  1.25mg  daily  #HLD: -Continue lipitor 40mg  daily; goal LDL <70    Medication Adjustments/Labs and Tests Ordered: Current medicines are reviewed at length with the patient today.  Concerns regarding medicines are outlined above.  No orders of the defined types were placed in this encounter.  No orders of the defined types were placed in this encounter.   There are no Patient Instructions on file for this visit.    Signed, Freada Bergeron, MD  07/06/2020 2:53 PM    [facility]

## 2020-07-08 ENCOUNTER — Ambulatory Visit: Payer: BC Managed Care – PPO | Admitting: Cardiology

## 2020-08-22 ENCOUNTER — Telehealth: Payer: Self-pay

## 2020-08-22 DIAGNOSIS — Z006 Encounter for examination for normal comparison and control in clinical research program: Secondary | ICD-10-CM

## 2020-08-22 NOTE — Telephone Encounter (Signed)
I have attempted without success to contact this patient by phone for his Identify 90 day follow up phone call. I left a message for patient to return my phone call with my name and callback number. An e-mail was also sent to patient.   

## 2020-09-12 ENCOUNTER — Telehealth: Payer: Self-pay

## 2020-09-12 NOTE — Telephone Encounter (Signed)
Patient's wife calls nurse line regarding patient having poison ivy rash. Reports rash has been going on for two weeks with no relief. They have been using calamine lotion. Reports that rash is spreading.    Patient will go to urgent care for further evaluation and treatment.   Talbot Grumbling, RN

## 2020-10-29 ENCOUNTER — Other Ambulatory Visit: Payer: Self-pay | Admitting: Family Medicine

## 2021-04-09 ENCOUNTER — Other Ambulatory Visit: Payer: Self-pay | Admitting: *Deleted

## 2021-04-09 MED ORDER — VALSARTAN 40 MG PO TABS: 40.0000 mg | ORAL_TABLET | Freq: Every day | ORAL | 0 refills | Status: DC

## 2021-04-15 ENCOUNTER — Other Ambulatory Visit: Payer: Self-pay | Admitting: *Deleted

## 2021-04-15 MED ORDER — VALSARTAN 40 MG PO TABS: 40.0000 mg | ORAL_TABLET | Freq: Every day | ORAL | 0 refills | Status: DC

## 2021-04-29 ENCOUNTER — Other Ambulatory Visit: Payer: Self-pay

## 2021-05-01 ENCOUNTER — Other Ambulatory Visit: Payer: Self-pay | Admitting: Family Medicine

## 2021-05-01 ENCOUNTER — Other Ambulatory Visit: Payer: Self-pay | Admitting: *Deleted

## 2021-05-01 MED ORDER — VALSARTAN 40 MG PO TABS: 40.0000 mg | ORAL_TABLET | Freq: Every day | ORAL | 0 refills | Status: DC

## 2021-05-17 NOTE — Progress Notes (Deleted)
?Cardiology Office Note:   ? ?Date:  05/17/2021  ? ?ID:  David Carrillo, DOB 1960/03/02, MRN [id number] ? ?PCP:  Lind Covert, MD ?  ?[place]  ?Cardiologist:  None  ?Advanced Practice Provider:  No care team member to display ?Electrophysiologist:  None  ? ? ?Referring MD: Lind Covert, *  ? ? ?History of Present Illness:   ? ?David Carrillo is a 61 y.o. male with a hx of HTN and GERD who presents to clinic for follow-up. ? ?Was last seen in clinic on 03/2020 for worsening exertional SOB as well as episodes of lightheadedness. Coronary CTA 04/2020 showed mild disease in the prox LAD, Ca score 560 (91%). TTE 04/2020 showed LVEF 60-65%, G1DD, normal RV, trivial MR, mild aortic sclerosis. ? ?Today, *** ? ? ?Prior smoker. 30 year pack history. CT chest for lung cancer screening with calcification in LM, LAD, LCx, and RCA ? ?Family history: Father with massive MI at 45; mother with MI at 72 ? ?Past Medical History:  ?Diagnosis Date  ? GERD (gastroesophageal reflux disease)   ? Hypertension   ? borderline-no meds  ? Neuromuscular disorder ([place])   ? some numbness rt hand-7/13  ? ? ?Past Surgical History:  ?Procedure Laterality Date  ? SHOULDER ARTHROSCOPY  3 years ago  ? ? ?Current Medications: ?No outpatient medications have been marked as taking for the 05/19/21 encounter (Appointment) with Freada Bergeron, MD.  ?  ? ?Allergies:   Patient has no known allergies.  ? ?Social History  ? ?Socioeconomic History  ? Marital status: Married  ?  Spouse name: Not on file  ? Number of children: Not on file  ? Years of education: Not on file  ? Highest education level: Not on file  ?Occupational History  ? Not on file  ?Tobacco Use  ? Smoking status: Former  ?  Types: Cigarettes  ?  Quit date: 08/05/2002  ?  Years since quitting: 18.7  ? Smokeless tobacco: Never  ?Substance and Sexual Activity  ? Alcohol use: No  ? Drug use: No  ? Sexual activity: Not on file  ?Other Topics Concern  ? Not  on file  ?Social History Narrative  ? Not on file  ? ?Social Determinants of Health  ? ?Financial Resource Strain: Not on file  ?Food Insecurity: Not on file  ?Transportation Needs: Not on file  ?Physical Activity: Not on file  ?Stress: Not on file  ?Social Connections: Not on file  ?  ? ?Family History: ?The patient's family history is negative for Colon cancer. ? ?ROS:   ?Please see the history of present illness.    ?Review of Systems  ?Constitutional:  Negative for chills and fever.  ?HENT:  Negative for hearing loss and sore throat.   ?Eyes:  Negative for blurred vision and redness.  ?Respiratory:  Positive for shortness of breath.   ?Cardiovascular:  Positive for chest pain. Negative for palpitations, orthopnea, claudication, leg swelling and PND.  ?Gastrointestinal:  Negative for nausea and vomiting.  ?Genitourinary:  Negative for dysuria and flank pain.  ?Musculoskeletal:  Negative for falls.  ?Neurological:  Positive for dizziness. Negative for loss of consciousness.  ?Endo/Heme/Allergies:  Negative for polydipsia.  ?Psychiatric/Behavioral:  Negative for substance abuse.   ? ?EKGs/Labs/Other Studies Reviewed:   ? ?The following studies were reviewed today: ?TTE 04/2020: ?IMPRESSIONS  ? ? ? 1. Left ventricular ejection fraction, by estimation, is 60 to 65%. The  ?left ventricle has normal  function. The left ventricle has no regional  ?wall motion abnormalities. Left ventricular diastolic parameters are  ?consistent with Grade I diastolic  ?dysfunction (impaired relaxation).  ? 2. Right ventricular systolic function is normal. The right ventricular  ?size is normal. There is normal pulmonary artery systolic pressure. The  ?estimated right ventricular systolic pressure is 78.2 mmHg.  ? 3. The mitral valve is normal in structure. Trivial mitral valve  ?regurgitation. No evidence of mitral stenosis.  ? 4. The aortic valve is tricuspid. There is mild calcification of the  ?aortic valve. There is mild thickening  of the aortic valve. Aortic valve  ?regurgitation is trivial. Mild aortic valve sclerosis is present, with no  ?evidence of aortic valve stenosis.  ? 5. The inferior vena cava is normal in size with greater than 50%  ?respiratory variability, suggesting right atrial pressure of 3 mmHg.  ? ?Comparison(s): No prior Echocardiogram.  ?Coronary CTA 04/2020: ?TECHNIQUE: ?A non-contrast, gated CT scan was obtained with axial slices of 3 mm ?through the heart for calcium scoring. Calcium scoring was performed ?using the Agatston method. A 120 kV prospective, gated, contrast ?cardiac scan was obtained. Gantry rotation speed was 250 msecs and ?collimation was 0.6 mm. Two sublingual nitroglycerin tablets (0.8 ?mg) were given. The 3D data set was reconstructed in 5% intervals of ?the 35-75% of the R-R cycle. Diastolic phases were analyzed on a ?dedicated workstation using MPR, MIP, and VRT modes. The patient ?received 95 cc of contrast. ?  ?FINDINGS: ?Image quality: Excellent. ?  ?Noise artifact is: Limited. ?  ?Coronary Arteries:  Normal coronary origin.  Right dominance. ?  ?Left main: The left main is a large caliber vessel with a normal ?take off from the left coronary cusp that trifurcates into a LAD, ?LCX, and ramus intermedius. There is minimal calcified plaque ?(<25%). ?  ?Left anterior descending artery: There is a dual LAD system with the ?smaller vessel medially. The medial LAD contains no plaque. The ?lateral vessel that supplies the apex contains mild calcified plaque ?in the proximal segment (25-49%). The mid segment contains minimal ?calcified plaque (<25%). The distal segment is patent. The LAD gives ?off 1 patent diagonal branch. ?  ?Ramus intermedius: Patent with no evidence of plaque or stenosis. ?  ?Left circumflex artery: The LCX is non-dominant. The proximal LCX ?contains mild calcified plaque (25-49%). The LCX gives off 2 patent ?obtuse marginal branches. ?  ?Right coronary artery: The RCA is dominant  with normal take off from ?the right coronary cusp. The proximal RCA contains minimal calcified ?plaque (<25%). The mid and distal segments are patent. The RCA ?terminates as a PDA and right posterolateral branch without evidence ?of plaque or stenosis. ?  ?Right Atrium: Right atrial size is within normal limits. ?  ?Right Ventricle: The right ventricular cavity is within normal ?limits. ?  ?Left Atrium: Left atrial size is normal in size with no left atrial ?appendage filling defect. ?  ?Left Ventricle: The ventricular cavity size is within normal limits. ?There are no stigmata of prior infarction. There is no abnormal ?filling defect. ?  ?Pulmonary arteries: Normal in size without proximal filling defect. ?  ?Pulmonary veins: Normal pulmonary venous drainage. ?  ?Pericardium: Normal thickness with no significant effusion or ?calcium present. ?  ?Cardiac valves: The aortic valve is trileaflet without significant ?calcification. The mitral valve is normal structure without ?significant calcification. ?  ?Aorta: Normal caliber with no significant disease. ?  ?Extra-cardiac findings: See attached radiology report for ?non-cardiac structures. ?  ?  IMPRESSION: ?1. Coronary calcium score of 560. This was 91st percentile for age-, ?sex, and race-matched controls. ?  ?2. Normal coronary origin with right dominance. ?  ?3. Mild calcified plaque (25-49%) in the proximal LAD and proximal ?LCX. ?  ?RECOMMENDATIONS: ?1. Mild non-obstructive CAD (25-49%). Consider non-atherosclerotic ?causes of chest pain. Consider preventive therapy and risk factor ?modification. CT FFR will be sent due to proximal location or ?calcified plaque. ?CT chest 07/2017: ?FINDINGS: ?Cardiovascular: Heart size is normal. There is no significant ?pericardial fluid, thickening or pericardial calcification. There is ?aortic atherosclerosis, as well as atherosclerosis of the great ?vessels of the mediastinum and the coronary arteries, including ?calcified  atherosclerotic plaque in the left main, left anterior ?descending, left circumflex and right coronary arteries. ?  ?Mediastinum/Nodes: No pathologically enlarged mediastinal or hilar ?lymph nodes. Please

## 2021-05-19 ENCOUNTER — Ambulatory Visit (INDEPENDENT_AMBULATORY_CARE_PROVIDER_SITE_OTHER): Payer: Self-pay | Admitting: Cardiology

## 2021-05-19 ENCOUNTER — Encounter: Payer: Self-pay | Admitting: Cardiology

## 2021-05-19 VITALS — BP 138/88 | HR 88 | Ht 69.0 in | Wt 210.0 lb

## 2021-05-19 DIAGNOSIS — E782 Mixed hyperlipidemia: Secondary | ICD-10-CM

## 2021-05-19 DIAGNOSIS — I10 Essential (primary) hypertension: Secondary | ICD-10-CM

## 2021-05-19 DIAGNOSIS — I2510 Atherosclerotic heart disease of native coronary artery without angina pectoris: Secondary | ICD-10-CM

## 2021-05-19 DIAGNOSIS — R42 Dizziness and giddiness: Secondary | ICD-10-CM

## 2021-05-19 DIAGNOSIS — Z720 Tobacco use: Secondary | ICD-10-CM

## 2021-05-19 MED ORDER — VALSARTAN 80 MG PO TABS: 80.0000 mg | ORAL_TABLET | Freq: Every day | ORAL | 3 refills | Status: DC

## 2021-05-19 NOTE — Patient Instructions (Signed)
Medication Instructions:  ? ?INCREASE YOUR VALSARTAN TO 80 MG BY MOUTH DAILY ? ?*If you need a refill on your cardiac medications before your next appointment, please call your pharmacy* ? ? ?Lab Work: ? ?IN ONE WEEK HERE IN THE OFFICE--CHECK BMET AND LIPIDS--COME FASTING TO THIS LAB APPOINTMENT ? ?If you have labs (blood work) drawn today and your tests are completely normal, you will receive your results only by: ?MyChart Message (if you have MyChart) OR ?A paper copy in the mail ?If you have any lab test that is abnormal or we need to change your treatment, we will call you to review the results. ? ? ?Follow-Up: ?At [facility], you and your health needs are our priority.  As part of our continuing mission to provide you with exceptional heart care, we have created designated Provider Care Teams.  These Care Teams include your primary Cardiologist (physician) and Advanced Practice Providers (APPs -  Physician Assistants and Nurse Practitioners) who all work together to provide you with the care you need, when you need it. ? ?We recommend signing up for the patient portal called "MyChart".  Sign up information is provided on this After Visit Summary.  MyChart is used to connect with patients for Virtual Visits (Telemedicine).  Patients are able to view lab/test results, encounter notes, upcoming appointments, etc.  Non-urgent messages can be sent to your provider as well.   ?To learn more about what you can do with MyChart, go to [email or web address].   ? ?Your next appointment:   ?1 year(s) ? ?The format for your next appointment:   ?In Person ? ?Provider:   ?DR. PEMBERTON ? ?Important Information About Sugar ? ? ? ? ? ? ?

## 2021-05-19 NOTE — Progress Notes (Signed)
?Cardiology Office Note:   ? ?Date:  05/19/2021  ? ?ID:  David Carrillo, DOB 1960/11/14, MRN [id number] ? ?PCP:  Lind Covert, MD ?  ?[place]  ?Cardiologist:  None  ?Advanced Practice Provider:  No care team member to display ?Electrophysiologist:  None  ? ? ?Referring MD: Lind Covert, *  ? ? ?History of Present Illness:   ? ?David Carrillo is a 61 y.o. male with a hx of HTN and GERD who presents to clinic for follow-up. ? ?Was last seen in clinic on 03/2020 for worsening exertional SOB as well as episodes of lightheadedness. Coronary CTA 04/2020 showed mild disease in the prox LAD, Ca score 560 (91%). TTE 04/2020 showed LVEF 60-65%, G1DD, normal RV, trivial MR, mild aortic sclerosis. ? ? ?During a previous visit on, 04/02/2020,  He used to smoke for 30 years, pack history. CT chest for lung cancer screening with calcification in LM, LAD, LCx, and RCA ? ?Family history: Father with massive MI at 74; mother with MI at 23 ? ?Today, he feels well.  Continues to have mild DOE but no chest pain. Dizziness has resolved. Tolerating all his meds without issues. SBP running mainly 140s at home.  ? ?He has not smoked since 2004.  ? ?The patient denies chest pain, nocturnal dyspnea, orthopnea or peripheral edema. There have been no palpitations, lightheadedness or syncope. Complains of Shortness of Breath. ? ?Past Medical History:  ?Diagnosis Date  ? GERD (gastroesophageal reflux disease)   ? Hypertension   ? borderline-no meds  ? Neuromuscular disorder ([place])   ? some numbness rt hand-7/13  ? ? ?Past Surgical History:  ?Procedure Laterality Date  ? SHOULDER ARTHROSCOPY  3 years ago  ? ? ?Current Medications: ?Current Meds  ?Medication Sig  ? aspirin EC 81 MG tablet Take 1 tablet (81 mg total) by mouth daily. Swallow whole.  ? atorvastatin (LIPITOR) 40 MG tablet TAKE 1 TABLET BY MOUTH EVERY DAY  ? indapamide (LOZOL) 1.25 MG tablet Take 1 tablet (1.25 mg total) by mouth daily. Pls come  in for an office visit  ? metoprolol tartrate (LOPRESSOR) 100 MG tablet Take one tablet by mouth 2 hours prior to your CT  ? valsartan (DIOVAN) 80 MG tablet Take 1 tablet (80 mg total) by mouth daily.  ? [DISCONTINUED] valsartan (DIOVAN) 40 MG tablet Take 1 tablet (40 mg total) by mouth daily. Patient needs appointment for any future refills. Please call office at [phone] to schedule appointment. 1st attempt.  ?  ? ?Allergies:   Patient has no known allergies.  ? ?Social History  ? ?Socioeconomic History  ? Marital status: Married  ?  Spouse name: Not on file  ? Number of children: Not on file  ? Years of education: Not on file  ? Highest education level: Not on file  ?Occupational History  ? Not on file  ?Tobacco Use  ? Smoking status: Former  ?  Types: Cigarettes  ?  Quit date: 08/05/2002  ?  Years since quitting: 18.8  ? Smokeless tobacco: Never  ?Substance and Sexual Activity  ? Alcohol use: No  ? Drug use: No  ? Sexual activity: Not on file  ?Other Topics Concern  ? Not on file  ?Social History Narrative  ? Not on file  ? ?Social Determinants of Health  ? ?Financial Resource Strain: Not on file  ?Food Insecurity: Not on file  ?Transportation Needs: Not on file  ?Physical Activity: Not on file  ?  Stress: Not on file  ?Social Connections: Not on file  ?  ? ?Family History: ?The patient's family history is negative for Colon cancer. ? ?ROS:   ?Please see the history of present illness. ?(+) Shortness of breath ?All other systems are reviewed and negative.   ? ?EKGs/Labs/Other Studies Reviewed:   ? ?The following studies were reviewed today: ?TTE 04/2020: ?IMPRESSIONS  ? ? 1. Left ventricular ejection fraction, by estimation, is 60 to 65%. The  ?left ventricle has normal function. The left ventricle has no regional  ?wall motion abnormalities. Left ventricular diastolic parameters are  ?consistent with Grade I diastolic  ?dysfunction (impaired relaxation).  ? 2. Right ventricular systolic function is normal. The  right ventricular  ?size is normal. There is normal pulmonary artery systolic pressure. The  ?estimated right ventricular systolic pressure is 69.6 mmHg.  ? 3. The mitral valve is normal in structure. Trivial mitral valve  ?regurgitation. No evidence of mitral stenosis.  ? 4. The aortic valve is tricuspid. There is mild calcification of the  ?aortic valve. There is mild thickening of the aortic valve. Aortic valve  ?regurgitation is trivial. Mild aortic valve sclerosis is present, with no  ?evidence of aortic valve stenosis.  ? 5. The inferior vena cava is normal in size with greater than 50%  ?respiratory variability, suggesting right atrial pressure of 3 mmHg.  ? ?Comparison(s): No prior Echocardiogram.  ?Coronary CTA 04/2020: ?TECHNIQUE: ?A non-contrast, gated CT scan was obtained with axial slices of 3 mm ?through the heart for calcium scoring. Calcium scoring was performed ?using the Agatston method. A 120 kV prospective, gated, contrast ?cardiac scan was obtained. Gantry rotation speed was 250 msecs and ?collimation was 0.6 mm. Two sublingual nitroglycerin tablets (0.8 ?mg) were given. The 3D data set was reconstructed in 5% intervals of ?the 35-75% of the R-R cycle. Diastolic phases were analyzed on a ?dedicated workstation using MPR, MIP, and VRT modes. The patient ?received 95 cc of contrast. ?  ?FINDINGS: ?Image quality: Excellent. ?  ?Noise artifact is: Limited. ?  ?Coronary Arteries:  Normal coronary origin.  Right dominance. ?  ?Left main: The left main is a large caliber vessel with a normal ?take off from the left coronary cusp that trifurcates into a LAD, ?LCX, and ramus intermedius. There is minimal calcified plaque ?(<25%). ?  ?Left anterior descending artery: There is a dual LAD system with the ?smaller vessel medially. The medial LAD contains no plaque. The ?lateral vessel that supplies the apex contains mild calcified plaque ?in the proximal segment (25-49%). The mid segment contains  minimal ?calcified plaque (<25%). The distal segment is patent. The LAD gives ?off 1 patent diagonal branch. ?  ?Ramus intermedius: Patent with no evidence of plaque or stenosis. ?  ?Left circumflex artery: The LCX is non-dominant. The proximal LCX ?contains mild calcified plaque (25-49%). The LCX gives off 2 patent ?obtuse marginal branches. ?  ?Right coronary artery: The RCA is dominant with normal take off from ?the right coronary cusp. The proximal RCA contains minimal calcified ?plaque (<25%). The mid and distal segments are patent. The RCA ?terminates as a PDA and right posterolateral branch without evidence ?of plaque or stenosis. ?  ?Right Atrium: Right atrial size is within normal limits. ?  ?Right Ventricle: The right ventricular cavity is within normal ?limits. ?  ?Left Atrium: Left atrial size is normal in size with no left atrial ?appendage filling defect. ?  ?Left Ventricle: The ventricular cavity size is within normal limits. ?  There are no stigmata of prior infarction. There is no abnormal ?filling defect. ?  ?Pulmonary arteries: Normal in size without proximal filling defect. ?  ?Pulmonary veins: Normal pulmonary venous drainage. ?  ?Pericardium: Normal thickness with no significant effusion or ?calcium present. ?  ?Cardiac valves: The aortic valve is trileaflet without significant ?calcification. The mitral valve is normal structure without ?significant calcification. ?  ?Aorta: Normal caliber with no significant disease. ?  ?Extra-cardiac findings: See attached radiology report for ?non-cardiac structures. ?  ?IMPRESSION: ?1. Coronary calcium score of 560. This was 91st percentile for age-, ?sex, and race-matched controls. ?  ?2. Normal coronary origin with right dominance. ?  ?3. Mild calcified plaque (25-49%) in the proximal LAD and proximal ?LCX. ?  ?RECOMMENDATIONS: ?1. Mild non-obstructive CAD (25-49%). Consider non-atherosclerotic ?causes of chest pain. Consider preventive therapy and risk  factor ?modification. CT FFR will be sent due to proximal location or ?calcified plaque. ?CT chest 07/2017: ?FINDINGS: ?Cardiovascular: Heart size is normal. There is no significant ?pericardial fluid, thickening

## 2021-05-26 ENCOUNTER — Other Ambulatory Visit: Payer: Self-pay | Admitting: *Deleted

## 2021-05-26 DIAGNOSIS — E782 Mixed hyperlipidemia: Secondary | ICD-10-CM

## 2021-05-26 DIAGNOSIS — I2510 Atherosclerotic heart disease of native coronary artery without angina pectoris: Secondary | ICD-10-CM

## 2021-05-26 DIAGNOSIS — I10 Essential (primary) hypertension: Secondary | ICD-10-CM

## 2021-05-26 DIAGNOSIS — R42 Dizziness and giddiness: Secondary | ICD-10-CM

## 2021-05-26 LAB — BASIC METABOLIC PANEL
BUN/Creatinine Ratio: 12 (ref 10–24)
BUN: 13 mg/dL (ref 8–27)
CO2: 25 mmol/L (ref 20–29)
Calcium: 9.6 mg/dL (ref 8.6–10.2)
Chloride: 103 mmol/L (ref 96–106)
Creatinine, Ser: 1.07 mg/dL (ref 0.76–1.27)
Glucose: 106 mg/dL — ABNORMAL HIGH (ref 70–99)
Potassium: 3.8 mmol/L (ref 3.5–5.2)
Sodium: 141 mmol/L (ref 134–144)
eGFR: 78 mL/min/{1.73_m2} (ref >59)

## 2021-05-26 LAB — LIPID PANEL
Chol/HDL Ratio: 2.5 ratio (ref 0.0–5.0)
Cholesterol, Total: 121 mg/dL (ref 100–199)
HDL: 48 mg/dL (ref >39)
LDL Chol Calc (NIH): 49 mg/dL (ref 0–99)
Triglycerides: 141 mg/dL (ref 0–149)
VLDL Cholesterol Cal: 24 mg/dL (ref 5–40)

## 2021-07-30 ENCOUNTER — Other Ambulatory Visit: Payer: Self-pay | Admitting: Family Medicine

## 2021-11-12 ENCOUNTER — Other Ambulatory Visit: Payer: Self-pay | Admitting: Family Medicine

## 2022-02-15 NOTE — Progress Notes (Unsigned)
    SUBJECTIVE:   CHIEF COMPLAINT / HPI:   Hypertension Taking medications daily.  Has home blood pressure cuff - usually well controlled in 130/70-80s   Cough Persistent unchanged  Dyspnea   PERTINENT  PMH / PSH: *** Patient Active Problem List   Diagnosis Date Noted   Pain of left thumb 05/01/2019   Hyperlipidemia 01/02/2018   Hernia, inguinal, right 01/02/2018   Allergic rhinitis 06/02/2016   Left knee pain 04/24/2014   Shortness of breath 09/18/2012   Chronic cough 05/01/2012   ROTATOR CUFF INJURY, RIGHT SHOULDER 06/18/2009   HYPERTENSION, BENIGN ESSENTIAL 03/02/2007    Current Outpatient Medications  Medication Instructions   aspirin EC 81 mg, Oral, Daily, Swallow whole.   atorvastatin (LIPITOR) 40 mg, Oral, Daily   indapamide (LOZOL) 1.25 mg, Oral, Daily, Pls come in for an office visit   metoprolol tartrate (LOPRESSOR) 100 MG tablet Take one tablet by mouth 2 hours prior to your CT   valsartan (DIOVAN) 80 mg, Oral, Daily       05/19/2021    9:31 AM 05/05/2020   10:30 AM 05/05/2020   10:04 AM  Vitals with BMI  Height '5\' 9"'$     Weight 210 lbs    BMI 31    Systolic 098 119 147  Diastolic 88 75 84  Pulse 88  62      OBJECTIVE:   There were no vitals taken for this visit.  ***  ASSESSMENT/PLAN:   There are no diagnoses linked to this encounter.   There are no Patient Instructions on file for this visit.   Lind Covert, MD [place]

## 2022-02-16 ENCOUNTER — Other Ambulatory Visit: Payer: Self-pay | Admitting: Family Medicine

## 2022-02-16 ENCOUNTER — Ambulatory Visit (INDEPENDENT_AMBULATORY_CARE_PROVIDER_SITE_OTHER): Payer: Self-pay | Admitting: Family Medicine

## 2022-02-16 ENCOUNTER — Encounter: Payer: Self-pay | Admitting: Family Medicine

## 2022-02-16 ENCOUNTER — Other Ambulatory Visit: Payer: Self-pay

## 2022-02-16 VITALS — BP 133/89 | HR 102 | Ht 69.0 in | Wt 216.4 lb

## 2022-02-16 DIAGNOSIS — I10 Essential (primary) hypertension: Secondary | ICD-10-CM

## 2022-02-16 DIAGNOSIS — R053 Chronic cough: Secondary | ICD-10-CM

## 2022-02-16 MED ORDER — INDAPAMIDE 1.25 MG PO TABS: 1.2500 mg | ORAL_TABLET | Freq: Every day | ORAL | 2 refills | Status: DC

## 2022-02-16 NOTE — Assessment & Plan Note (Signed)
Persistent without red flags.  Unfortunately without insurance he does not wish further work up.  CT chest for cancer screening was unrevealing.   Likely vocal cord dysfunction or resistent rhinitis - Has not responded to sequential PPI, antihistamines decongestants.  Will try these together.

## 2022-02-16 NOTE — Assessment & Plan Note (Signed)
Stable.  Encouraged to take medications regularly.

## 2022-02-16 NOTE — Patient Instructions (Signed)
Good to see you today - Thank you for coming in  Things we discussed today:  Hypertension Check blood pressure once a day.   Your goal blood pressure is less than 140/90.  Check your blood pressure several times a week.  If regularly higher than this please let me know - either with MyChart or leaving a phone message. Next visit please bring in your blood pressure cuff.     Cough IF any worsening or bleeding let me know Try for a month flonase nasal spray and an antihistamine decongestant for one month  You need to Improve your Weight Drink water and other unsweetened drinks like coffee, milk, tea- No sweet drinks Eat smaller portions of starchy and sweet foods like rice, potatoes, wheat, corn, or fruits.  Eat twice as many vegetables  Exercise at least 30 minutes every day If you wish to discuss weight loss further, please let me know or make an appointment   Please always bring your medication bottles  Come back to see me in 6 months

## 2022-05-04 ENCOUNTER — Encounter: Payer: Self-pay | Admitting: Family Medicine

## 2022-05-04 ENCOUNTER — Ambulatory Visit (INDEPENDENT_AMBULATORY_CARE_PROVIDER_SITE_OTHER): Payer: Self-pay | Admitting: Family Medicine

## 2022-05-04 VITALS — BP 125/87 | HR 89 | Ht 69.0 in | Wt 206.6 lb

## 2022-05-04 DIAGNOSIS — I10 Essential (primary) hypertension: Secondary | ICD-10-CM

## 2022-05-04 DIAGNOSIS — Z125 Encounter for screening for malignant neoplasm of prostate: Secondary | ICD-10-CM

## 2022-05-04 DIAGNOSIS — R053 Chronic cough: Secondary | ICD-10-CM

## 2022-05-04 NOTE — Assessment & Plan Note (Signed)
At goal.  Check labs continue ARB and diuretic

## 2022-05-04 NOTE — Assessment & Plan Note (Signed)
Persistent.  Perhaps some better with PPI - he will continue to take.  No red flags.  Given duration do not feel further work up is needed now.  Once has insurance consider former tobacco use screening

## 2022-05-04 NOTE — Patient Instructions (Signed)
Good to see you today - Thank you for coming in  Things we discussed today:  Keep working on your weight   I will call you if your tests are not good.  Otherwise, I will send you a message on MyChart (if it is active) or a letter in the mail..  If you do not hear from me with in 2 weeks please call our office.     Keep taking all the medication as you are  Colonoscopy next year

## 2022-05-04 NOTE — Progress Notes (Signed)
    SUBJECTIVE:   CHIEF COMPLAINT / HPI:   Hypertension  Knows his medications and taking regularly.  No chest pain or lightheadness or shortness of breath or edema  Chronic Cough Continues mostly unchanged although maybe a little better taking his friends Nexium.  The combination decongestant antihistamine nasal steroid did not help.  No unplanned weight loss or hemoptysis or sputum or chest pain.  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  No murmurs, gallops or rubs.    Lungs:  Normal respiratory effort, chest expands symmetrically. Lungs are clear to auscultation, no crackles or wheezes. Extremities:  No cyanosis, edema, or deformity noted with good range of motion of all major joints.   Mobility:able to get up and down from exam table without assistance or distress   ASSESSMENT/PLAN:   Screening for malignant neoplasm of prostate -     PSA  Essential hypertension, benign Assessment & Plan: At goal.  Check labs continue ARB and diuretic   Orders: -     Basic metabolic panel  Chronic cough Assessment & Plan: Persistent.  Perhaps some better with PPI - he will continue to take.  No red flags.  Given duration do not feel further work up is needed now.  Once has insurance consider former tobacco use screening       Patient Instructions  Good to see you today - Thank you for coming in  Things we discussed today:  Keep working on your weight   I will call you if your tests are not good.  Otherwise, I will send you a message on MyChart (if it is active) or a letter in the mail..  If you do not hear from me with in 2 weeks please call our office.     Keep taking all the medication as you are  Colonoscopy next year     Carney Living, MD [facility]

## 2022-05-05 ENCOUNTER — Encounter: Payer: Self-pay | Admitting: Family Medicine

## 2022-05-05 LAB — BASIC METABOLIC PANEL
BUN/Creatinine Ratio: 15 (ref 10–24)
BUN: 13 mg/dL (ref 8–27)
CO2: 20 mmol/L (ref 20–29)
Calcium: 9.8 mg/dL (ref 8.6–10.2)
Chloride: 99 mmol/L (ref 96–106)
Creatinine, Ser: 0.88 mg/dL (ref 0.76–1.27)
Glucose: 92 mg/dL (ref 70–99)
Potassium: 3.8 mmol/L (ref 3.5–5.2)
Sodium: 138 mmol/L (ref 134–144)
eGFR: 97 mL/min/{1.73_m2} (ref >59)

## 2022-05-05 LAB — PSA: Prostate Specific Ag, Serum: 0.4 ng/mL (ref 0.0–4.0)

## 2022-05-18 ENCOUNTER — Encounter: Payer: Self-pay | Admitting: Internal Medicine

## 2022-05-18 ENCOUNTER — Other Ambulatory Visit: Payer: Self-pay

## 2022-05-18 DIAGNOSIS — R42 Dizziness and giddiness: Secondary | ICD-10-CM

## 2022-05-18 DIAGNOSIS — E782 Mixed hyperlipidemia: Secondary | ICD-10-CM

## 2022-05-18 DIAGNOSIS — I10 Essential (primary) hypertension: Secondary | ICD-10-CM

## 2022-05-18 DIAGNOSIS — I2510 Atherosclerotic heart disease of native coronary artery without angina pectoris: Secondary | ICD-10-CM

## 2022-05-18 MED ORDER — VALSARTAN 80 MG PO TABS: 80.0000 mg | ORAL_TABLET | Freq: Every day | ORAL | 0 refills | Status: DC

## 2022-06-11 ENCOUNTER — Ambulatory Visit: Payer: Self-pay | Admitting: Physician Assistant

## 2022-06-14 ENCOUNTER — Other Ambulatory Visit: Payer: Self-pay

## 2022-06-14 DIAGNOSIS — I2510 Atherosclerotic heart disease of native coronary artery without angina pectoris: Secondary | ICD-10-CM

## 2022-06-14 DIAGNOSIS — I10 Essential (primary) hypertension: Secondary | ICD-10-CM

## 2022-06-14 DIAGNOSIS — R42 Dizziness and giddiness: Secondary | ICD-10-CM

## 2022-06-14 DIAGNOSIS — E782 Mixed hyperlipidemia: Secondary | ICD-10-CM

## 2022-06-14 MED ORDER — VALSARTAN 80 MG PO TABS: 80.0000 mg | ORAL_TABLET | Freq: Every day | ORAL | 0 refills | Status: DC

## 2022-06-17 ENCOUNTER — Telehealth: Payer: Self-pay | Admitting: Cardiology

## 2022-06-17 DIAGNOSIS — E782 Mixed hyperlipidemia: Secondary | ICD-10-CM

## 2022-06-17 DIAGNOSIS — I10 Essential (primary) hypertension: Secondary | ICD-10-CM

## 2022-06-17 DIAGNOSIS — I2510 Atherosclerotic heart disease of native coronary artery without angina pectoris: Secondary | ICD-10-CM

## 2022-06-17 DIAGNOSIS — R42 Dizziness and giddiness: Secondary | ICD-10-CM

## 2022-06-17 MED ORDER — VALSARTAN 80 MG PO TABS: 80.0000 mg | ORAL_TABLET | Freq: Every day | ORAL | 3 refills | Status: DC

## 2022-06-17 NOTE — Telephone Encounter (Signed)
Pt c/o medication issue:  1. Name of Medication:   valsartan (DIOVAN) 80 MG tablet   2. How are you currently taking this medication (dosage and times per day)?   As prescribed  3. Are you having a reaction (difficulty breathing--STAT)?   No  4. What is your medication issue?   Patient stated he has a few of this medication left but patient stated he does not want to scheduled a f/u visit until he gets Medicaid next year as he does not want to pay for an office visit.

## 2022-06-17 NOTE — Telephone Encounter (Signed)
Dr. Shari Prows, ok to refill his valsartan?  Last seen back in May 2023.    Pt does not want to schedule a follow-up appt with our office until next year, when his medicare goes through.   He only has a few days worth of his valsartan and is requesting a refill of this medication, despite non-compliance with scheduling his routine follow-up appts with our office.  Please advise if you are ok with me filling this medication for the pt.  Thanks!

## 2022-06-17 NOTE — Telephone Encounter (Signed)
Jontay, [place] - 06/17/2022  3:55 PM Meriam Sprague, MD  Sent: Thu June 17, 2022  4:34 PM  To: Loa Socks, LPN         Message  Okay to refill! Thank you for checking!        Called the pt back and informed him that Dr. Evorn Gong to fill his valsartan until he qualifies to switch over to Medicare next year.  Did advise him that once his Medicare goes through, for further refills of his cardiac medication, he will need to schedule a follow-up appt with our office to receive this.   Pt states he is on a fixed income and the copay he would be required to pay to see our office right now would be $250.  He states he cannot afford this until next year.   Pt aware that I sent his refill into his pharmacy on file.   Pt verbalized understanding and agrees with this plan.

## 2022-07-09 ENCOUNTER — Other Ambulatory Visit: Payer: Self-pay

## 2022-07-09 DIAGNOSIS — I2510 Atherosclerotic heart disease of native coronary artery without angina pectoris: Secondary | ICD-10-CM

## 2022-07-09 DIAGNOSIS — I10 Essential (primary) hypertension: Secondary | ICD-10-CM

## 2022-07-09 DIAGNOSIS — R42 Dizziness and giddiness: Secondary | ICD-10-CM

## 2022-07-09 DIAGNOSIS — E782 Mixed hyperlipidemia: Secondary | ICD-10-CM

## 2022-07-09 MED ORDER — VALSARTAN 80 MG PO TABS: 80.0000 mg | ORAL_TABLET | Freq: Every day | ORAL | 3 refills | Status: DC

## 2022-11-10 ENCOUNTER — Other Ambulatory Visit: Payer: Self-pay | Admitting: Family Medicine

## 2022-12-03 ENCOUNTER — Encounter: Payer: Self-pay | Admitting: Family Medicine

## 2023-02-06 ENCOUNTER — Other Ambulatory Visit: Payer: Self-pay | Admitting: Family Medicine

## 2023-02-24 IMAGING — CT CT HEART MORP W/ CTA COR W/ SCORE W/ CA W/CM &/OR W/O CM
1 series · 12 of 20 positions shown, 15 images · non-contrast
Comparison: None.
COMPARISON: None.

Addendum:
EXAM:
OVER-READ INTERPRETATION  CT CHEST

The following report is an over-read performed by radiologist Dr.
Myle Maso [REDACTED] on 05/05/2020. This
over-read does not include interpretation of cardiac or coronary
anatomy or pathology. The coronary calcium score/coronary CTA
interpretation by the cardiologist is attached.
CLINICAL DATA: Chest pain
Cardiac/Coronary CTA
TECHNIQUE: A non-contrast, gated CT scan was obtained with axial slices of 3 mm
through the heart for calcium scoring. Calcium scoring was performed
using the Agatston method. A 120 kV prospective, gated, contrast
cardiac scan was obtained. Gantry rotation speed was 250 msecs and
collimation was 0.6 mm. Two sublingual nitroglycerin tablets (0.8
mg) were given. The 3D data set was reconstructed in 5% intervals of
the 35-75% of the R-R cycle. Diastolic phases were analyzed on a
dedicated workstation using MPR, MIP, and VRT modes. The patient
received 95 cc of contrast.

[Series 375: findings · 12 of 26 slices shown, 15 images]
[im 2/26  vessel]
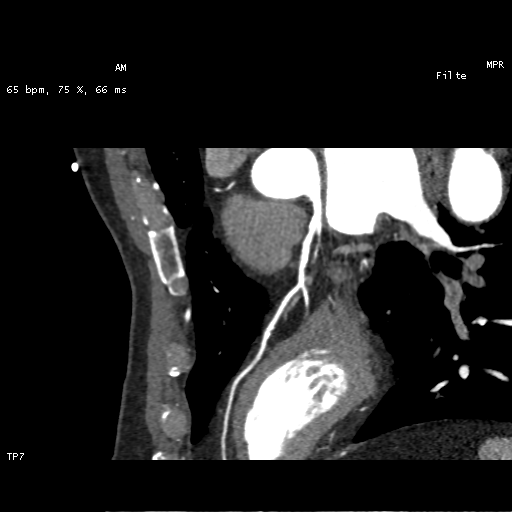
[im 2/26  lung]
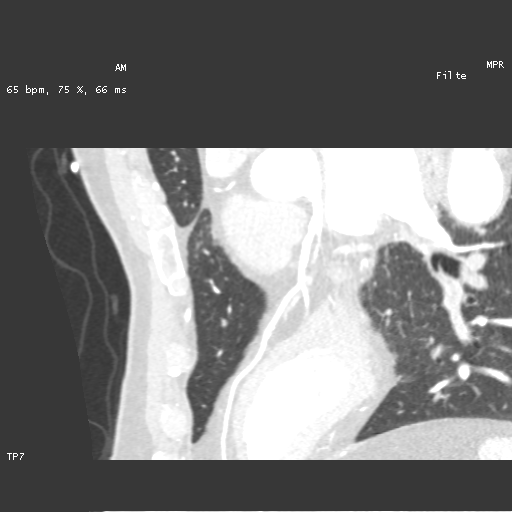
[im 4/26  vessel]
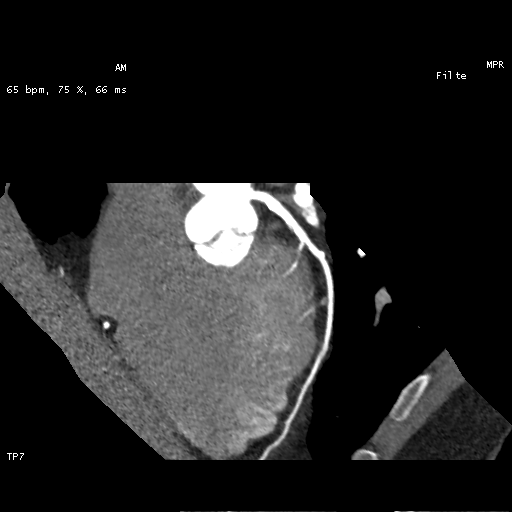
[im 6/26  vessel]
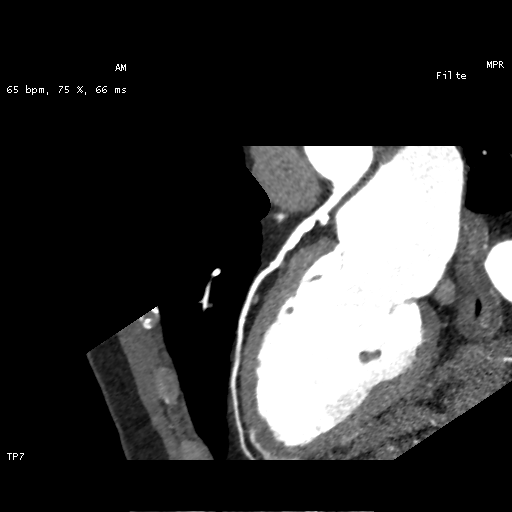
[im 8/26  vessel]
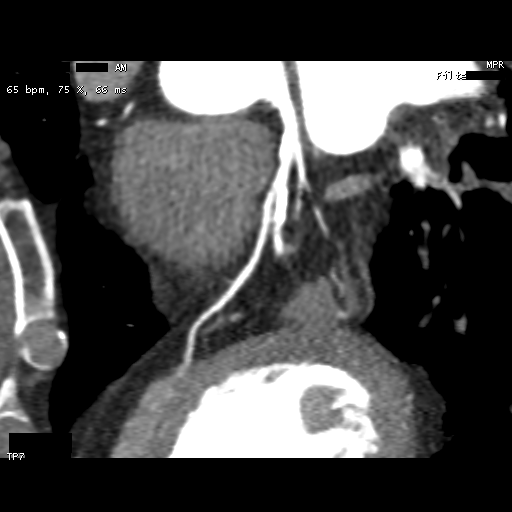
[im 10/26  vessel]
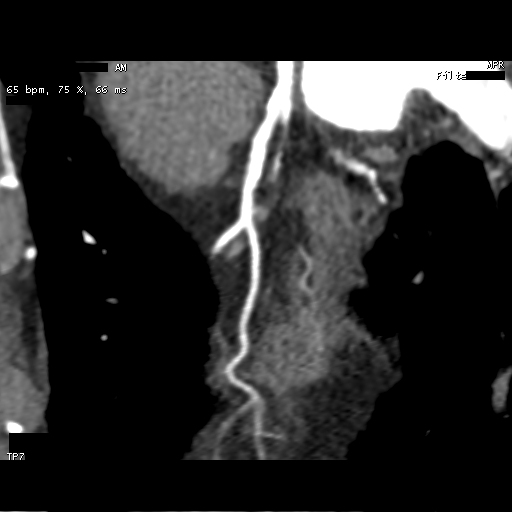
[im 10/26  lung]
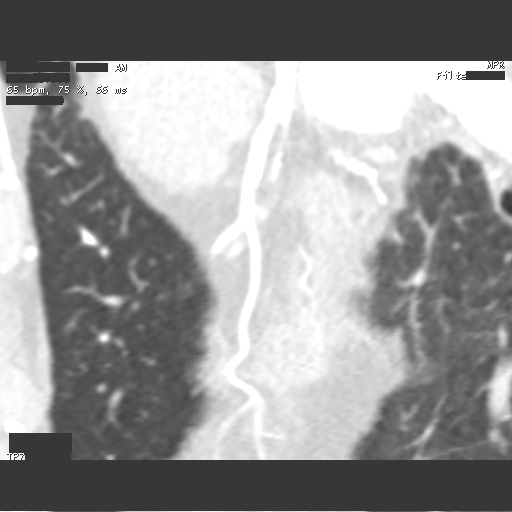
[im 12/26  vessel]
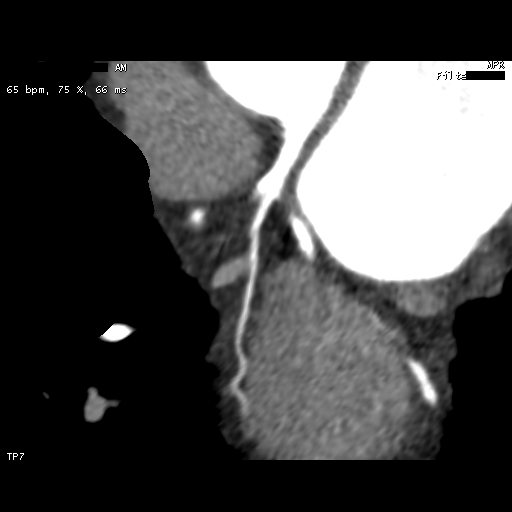
[im 14/26  vessel]
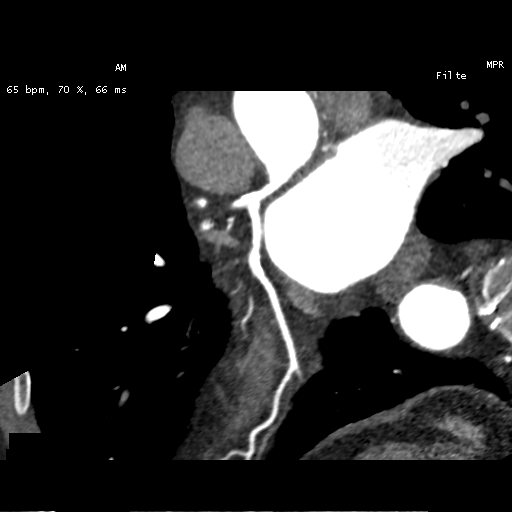
[im 16/26  vessel]
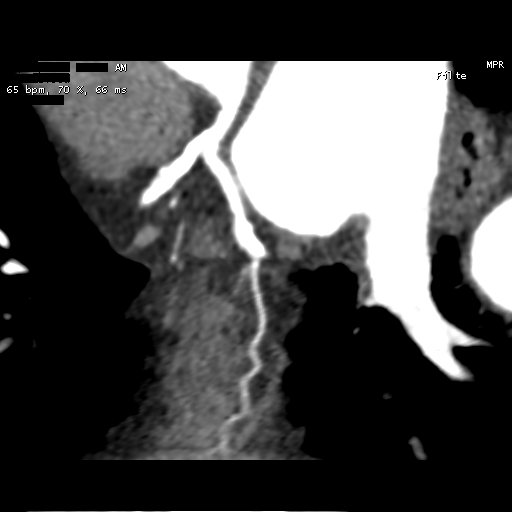
[im 18/26  vessel]
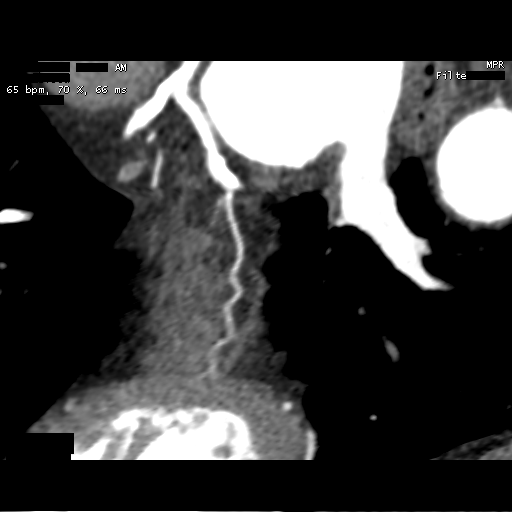
[im 18/26  lung]
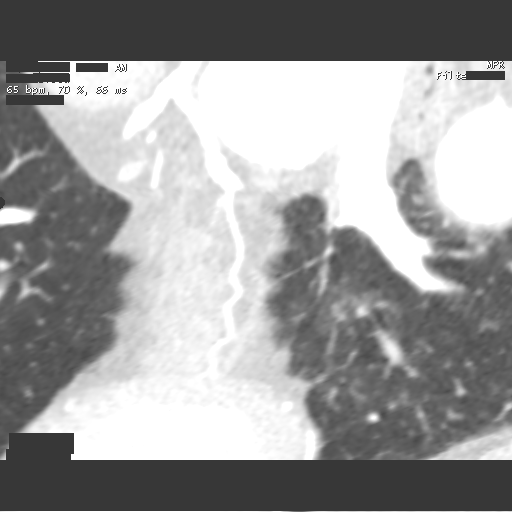
[im 20/26  vessel]
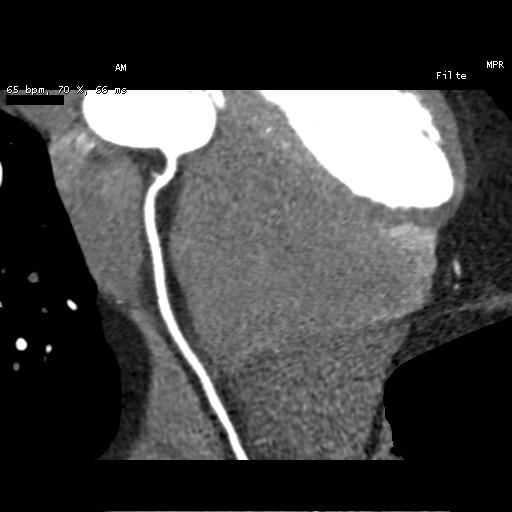
[im 22/26  vessel]
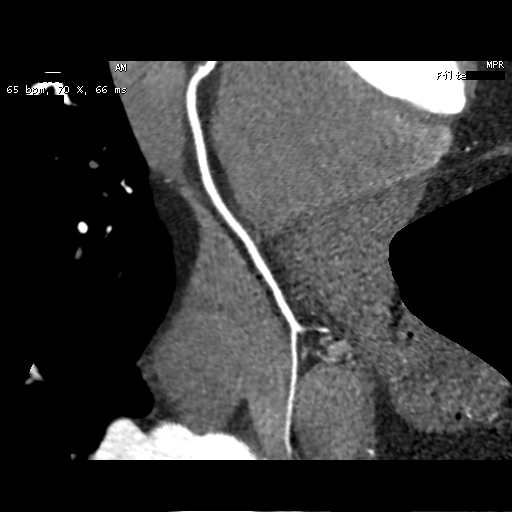
[im 24/26  vessel]
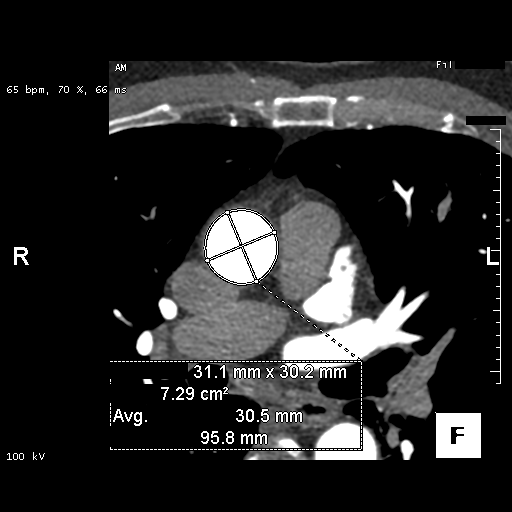

[12 of 20 positions shown; findings below may reference images not displayed]

FINDINGS: Aortic atherosclerosis. Within the visualized portions of the thorax
there are no suspicious appearing pulmonary nodules or masses, there
is no acute consolidative airspace disease, no pleural effusions, no
pneumothorax and no lymphadenopathy. Visualized portions of the
upper abdomen are unremarkable. There are no aggressive appearing
lytic or blastic lesions noted in the visualized portions of the
skeleton.
IMPRESSION: 1.  Aortic Atherosclerosis (EWA2J-7GE.E).
FINDINGS: Image quality: Excellent.

Noise artifact is: Limited.

Coronary Arteries:  Normal coronary origin.  Right dominance.

Left main: The left main is a large caliber vessel with a normal
take off from the left coronary cusp that trifurcates into a LAD,
LCX, and ramus intermedius. There is minimal calcified plaque
(<25%).

Left anterior descending artery: There is a dual LAD system with the
smaller vessel medially. The medial LAD contains no plaque. The
lateral vessel that supplies the apex contains mild calcified plaque
in the proximal segment (25-49%). The mid segment contains minimal
calcified plaque (<25%). The distal segment is patent. The LAD gives
off 1 patent diagonal branch.

Ramus intermedius: Patent with no evidence of plaque or stenosis.

Left circumflex artery: The LCX is non-dominant. The proximal LCX
contains mild calcified plaque (25-49%). The LCX gives off 2 patent
obtuse marginal branches.

Right coronary artery: The RCA is dominant with normal take off from
the right coronary cusp. The proximal RCA contains minimal calcified
plaque (<25%). The mid and distal segments are patent. The RCA
terminates as a PDA and right posterolateral branch without evidence
of plaque or stenosis.

Right Atrium: Right atrial size is within normal limits.

Right Ventricle: The right ventricular cavity is within normal
limits.

Left Atrium: Left atrial size is normal in size with no left atrial
appendage filling defect.

Left Ventricle: The ventricular cavity size is within normal limits.
There are no stigmata of prior infarction. There is no abnormal
filling defect.

Pulmonary arteries: Normal in size without proximal filling defect.

Pulmonary veins: Normal pulmonary venous drainage.

Pericardium: Normal thickness with no significant effusion or
calcium present.

Cardiac valves: The aortic valve is trileaflet without significant
calcification. The mitral valve is normal structure without
significant calcification.

Aorta: Normal caliber with no significant disease.

Extra-cardiac findings: See attached radiology report for
non-cardiac structures.
IMPRESSION: 1. Coronary calcium score of 560. This was 91st percentile for age-,
sex, and race-matched controls.

2. Normal coronary origin with right dominance.

3. Mild calcified plaque (25-49%) in the proximal LAD and proximal
LCX.

RECOMMENDATIONS:
1. Mild non-obstructive CAD (25-49%). Consider non-atherosclerotic
causes of chest pain. Consider preventive therapy and risk factor
modification. CT FFR will be sent due to proximal location or
calcified plaque.

*** End of Addendum ***
EXAM:
OVER-READ INTERPRETATION  CT CHEST

The following report is an over-read performed by radiologist Dr.
Myle Maso [REDACTED] on 05/05/2020. This
over-read does not include interpretation of cardiac or coronary
anatomy or pathology. The coronary calcium score/coronary CTA
interpretation by the cardiologist is attached.
FINDINGS: Aortic atherosclerosis. Within the visualized portions of the thorax
there are no suspicious appearing pulmonary nodules or masses, there
is no acute consolidative airspace disease, no pleural effusions, no
pneumothorax and no lymphadenopathy. Visualized portions of the
upper abdomen are unremarkable. There are no aggressive appearing
lytic or blastic lesions noted in the visualized portions of the
skeleton.
IMPRESSION: 1.  Aortic Atherosclerosis (EWA2J-7GE.E).

## 2023-05-06 ENCOUNTER — Other Ambulatory Visit: Payer: Self-pay | Admitting: Student

## 2023-06-20 ENCOUNTER — Ambulatory Visit: Payer: Self-pay | Admitting: Family Medicine

## 2023-06-20 VITALS — BP 132/79 | HR 87 | Ht 70.0 in | Wt 218.6 lb

## 2023-06-20 DIAGNOSIS — I10 Essential (primary) hypertension: Secondary | ICD-10-CM

## 2023-06-20 DIAGNOSIS — Z1211 Encounter for screening for malignant neoplasm of colon: Secondary | ICD-10-CM | POA: Diagnosis not present

## 2023-06-20 DIAGNOSIS — I2510 Atherosclerotic heart disease of native coronary artery without angina pectoris: Secondary | ICD-10-CM

## 2023-06-20 DIAGNOSIS — E782 Mixed hyperlipidemia: Secondary | ICD-10-CM

## 2023-06-20 LAB — POCT GLYCOSYLATED HEMOGLOBIN (HGB A1C): Hemoglobin A1C: 5.5 % (ref 4.0–5.6)

## 2023-06-20 MED ORDER — VALSARTAN 80 MG PO TABS: 80.0000 mg | ORAL_TABLET | Freq: Every day | ORAL | 3 refills | Status: DC

## 2023-06-20 MED ORDER — INDAPAMIDE 1.25 MG PO TABS: 1.2500 mg | ORAL_TABLET | Freq: Every day | ORAL | 0 refills | Status: DC

## 2023-06-20 MED ORDER — ATORVASTATIN CALCIUM 40 MG PO TABS: 40.0000 mg | ORAL_TABLET | Freq: Every day | ORAL | 0 refills | Status: DC

## 2023-06-20 NOTE — Assessment & Plan Note (Signed)
 On rosuvastatin 10 mg daily. -Lipid panel, A1c

## 2023-06-20 NOTE — Patient Instructions (Signed)
 It was wonderful to see you today! Thank you for choosing [facility].   Please bring ALL of your medications with you to every visit.   Today we talked about:  I refilled your blood pressure medications today please continue to take them as prescribed.  I do recommend checking your blood pressure occasionally at home with a goal of less than 130/80.  We do like to see you yearly to check blood work given blood pressure and her medications can impact her kidneys. I refilled your cholesterol medication we will check your cholesterol panel to make sure it is working for you. I referred you to the GI doctor to have a colonoscopy done.  I recommend calling to schedule it at your convenience in the next few months.  Your last one was in 2014, I highly recommend repeat so we do not miss possible colon cancer that we could intervene on early.  Please follow up in 1 year or sooner if needed  If you haven't already, sign up for My Chart to have easy access to your labs results, and communication with your primary care physician.   We are checking some labs today. If they are abnormal, I will call you. If they are normal, I will send you a MyChart message (if it is active) or a letter in the mail. If you do not hear about your labs in the next 2 weeks, please call the office.  Call the clinic at [phone] if your symptoms worsen or you have any concerns.  Please be sure to schedule follow up at the front desk before you leave today.   Jonne Netters, DO Family Medicine

## 2023-06-20 NOTE — Progress Notes (Signed)
    SUBJECTIVE:   CHIEF COMPLAINT / HPI:   Hypertension: - Medications: Valsartan  80 mg daily, indapamide  1.25 mg daily - Compliance: Yes - Denies any SOB, CP, vision changes, LE edema, medication SEs, or symptoms of hypotension   PERTINENT  PMH / PSH: HTN, HLD, CAD  OBJECTIVE:   BP 132/79   Pulse 87   Ht 5\' 10"  (1.778 m)   Wt 218 lb 9.6 oz (99.2 kg)   SpO2 96%   BMI 31.37 kg/m    General: NAD, pleasant, able to participate in exam Cardiac: RRR, no murmurs. Respiratory: CTAB, normal effort, No wheezes, rales or rhonchi Extremities: no edema or cyanosis. Skin: warm and dry, no rashes noted Neuro: alert, no obvious focal deficits Psych: Normal affect and mood  ASSESSMENT/PLAN:   Assessment & Plan Primary hypertension 132/79, at goal on current therapy. -Refill valsartan  80 mg daily and indapamide  1.25 mg daily -BMP Mixed hyperlipidemia On rosuvastatin 10 mg daily. -Lipid panel, A1c Coronary artery disease involving native coronary artery of native heart without angina pectoris Continue ASA 81 mg daily Screening for colon cancer Last colonoscopy in 2014, due for repeat. -Referral to GI   Dr. Jonne Netters, DO [facility]

## 2023-06-21 ENCOUNTER — Ambulatory Visit: Payer: Self-pay | Admitting: Family Medicine

## 2023-06-21 LAB — LIPID PANEL
Chol/HDL Ratio: 2.8 ratio (ref 0.0–5.0)
Cholesterol, Total: 124 mg/dL (ref 100–199)
HDL: 45 mg/dL (ref >39)
LDL Chol Calc (NIH): 59 mg/dL (ref 0–99)
Triglycerides: 111 mg/dL (ref 0–149)
VLDL Cholesterol Cal: 20 mg/dL (ref 5–40)

## 2023-06-21 LAB — BASIC METABOLIC PANEL WITH GFR
BUN/Creatinine Ratio: 16 (ref 10–24)
BUN: 15 mg/dL (ref 8–27)
CO2: 21 mmol/L (ref 20–29)
Calcium: 9.1 mg/dL (ref 8.6–10.2)
Chloride: 101 mmol/L (ref 96–106)
Creatinine, Ser: 0.92 mg/dL (ref 0.76–1.27)
Glucose: 93 mg/dL (ref 70–99)
Potassium: 4.3 mmol/L (ref 3.5–5.2)
Sodium: 142 mmol/L (ref 134–144)
eGFR: 92 mL/min/{1.73_m2} (ref >59)

## 2023-06-30 DIAGNOSIS — K080 Exfoliation of teeth due to systemic causes: Secondary | ICD-10-CM | POA: Diagnosis not present

## 2023-09-05 ENCOUNTER — Encounter: Payer: Self-pay | Admitting: Family Medicine

## 2023-09-06 ENCOUNTER — Encounter: Payer: Self-pay | Admitting: Internal Medicine

## 2023-09-19 ENCOUNTER — Ambulatory Visit

## 2023-09-22 ENCOUNTER — Ambulatory Visit (INDEPENDENT_AMBULATORY_CARE_PROVIDER_SITE_OTHER)

## 2023-09-22 VITALS — BP 128/78 | HR 100 | Ht 70.0 in | Wt 217.2 lb

## 2023-09-22 DIAGNOSIS — Z7189 Other specified counseling: Secondary | ICD-10-CM

## 2023-09-22 DIAGNOSIS — I10 Essential (primary) hypertension: Secondary | ICD-10-CM | POA: Diagnosis not present

## 2023-09-22 DIAGNOSIS — E782 Mixed hyperlipidemia: Secondary | ICD-10-CM | POA: Diagnosis not present

## 2023-09-22 MED ORDER — INDAPAMIDE 1.25 MG PO TABS: 1.2500 mg | ORAL_TABLET | Freq: Every day | ORAL | 5 refills | Status: AC

## 2023-09-22 MED ORDER — ATORVASTATIN CALCIUM 40 MG PO TABS: 40.0000 mg | ORAL_TABLET | Freq: Every day | ORAL | 5 refills | Status: AC

## 2023-09-22 MED ORDER — VALSARTAN 80 MG PO TABS: 80.0000 mg | ORAL_TABLET | Freq: Every day | ORAL | 3 refills | Status: AC

## 2023-09-22 NOTE — Assessment & Plan Note (Signed)
-   stable controlled on 40mg  lipitor

## 2023-09-22 NOTE — Assessment & Plan Note (Signed)
-   chronic, stable, controlled. Refill indapamide  1.25mg  and valsartan  80mg . Labs done in June 2025, repeat yearly

## 2023-09-22 NOTE — Progress Notes (Signed)
    SUBJECTIVE:   CHIEF COMPLAINT / HPI:   HTN - on 80mg  valsartan  and 1.25mg  indapamide  - Doing well, no side effects, no concerns today BP Readings from Last 5 Encounters:  09/22/23 128/78  06/20/23 132/79  05/04/22 125/87  02/16/22 133/89  05/19/21 138/88   HLD - on 40mg  lipitor Lab Results  Component Value Date   CHOL 124 06/20/2023   HDL 45 06/20/2023   LDLCALC 59 06/20/2023   LDLDIRECT 51 05/14/2015   TRIG 111 06/20/2023   CHOLHDL 2.8 06/20/2023     PERTINENT  PMH / PSH: as above  OBJECTIVE:   BP 128/78   Pulse 100   Ht 5' 10 (1.778 m)   Wt 217 lb 3.2 oz (98.5 kg)   SpO2 96%   BMI 31.16 kg/m    Physical Exam General: Alert, conversant, cooperative. No acute distress.  HEENT: PERRL. EOMI. MMM.  Cardiovascular: RRR Respiratory: Lungs CTAB. Normal work of breathing. Abdomen: Non distended Extremities: No cyanosis. No edema Musculoskeletal: No gross deformities.  Skin: Warm. Dry. No rashes. No icterus.  Neurologic: No focal deficits. Moving all extremities. Psychiatric: Cooperative. Appropriate mood. Appropriate affect.     ASSESSMENT/PLAN:   Assessment & Plan Mixed hyperlipidemia - stable controlled on 40mg  lipitor Primary hypertension - chronic, stable, controlled. Refill indapamide  1.25mg  and valsartan  80mg . Labs done in June 2025, repeat yearly Counseling on health promotion and disease prevention - colonoscopy scheduled - declined flu and other vaccines     Milda LITTIE Deed, MD [facility]

## 2023-09-22 NOTE — Patient Instructions (Signed)
 Thank you for seeing us  today. Your BP was in a normal range. Refills of your BP meds were sent to the pharmacy. You have a colonoscopy scheduled. Please reach out if you would like to update your flu vaccine.

## 2023-10-11 ENCOUNTER — Other Ambulatory Visit: Payer: Self-pay

## 2023-10-11 ENCOUNTER — Ambulatory Visit

## 2023-10-11 VITALS — Ht 70.0 in | Wt 215.0 lb

## 2023-10-11 DIAGNOSIS — Z1211 Encounter for screening for malignant neoplasm of colon: Secondary | ICD-10-CM

## 2023-10-11 MED ORDER — NA SULFATE-K SULFATE-MG SULF 17.5-3.13-1.6 GM/177ML PO SOLN: 1.0000 | Freq: Once | ORAL | 0 refills | Status: AC

## 2023-10-11 NOTE — Progress Notes (Signed)
 Denies allergies to eggs or soy products. Denies complication of anesthesia or sedation. Denies use of weight loss medication. Denies use of O2.   Emmi instructions given for colonoscopy.

## 2023-10-12 ENCOUNTER — Encounter: Payer: Self-pay | Admitting: Internal Medicine

## 2023-10-25 ENCOUNTER — Ambulatory Visit: Admitting: Internal Medicine

## 2023-10-25 ENCOUNTER — Encounter: Payer: Self-pay | Admitting: Internal Medicine

## 2023-10-25 ENCOUNTER — Other Ambulatory Visit: Payer: Self-pay | Admitting: Internal Medicine

## 2023-10-25 VITALS — BP 132/87 | HR 79 | Temp 98.2°F | Resp 15 | Ht 70.0 in | Wt 215.0 lb

## 2023-10-25 DIAGNOSIS — D123 Benign neoplasm of transverse colon: Secondary | ICD-10-CM | POA: Diagnosis not present

## 2023-10-25 DIAGNOSIS — K648 Other hemorrhoids: Secondary | ICD-10-CM

## 2023-10-25 DIAGNOSIS — D122 Benign neoplasm of ascending colon: Secondary | ICD-10-CM | POA: Diagnosis not present

## 2023-10-25 DIAGNOSIS — Z1211 Encounter for screening for malignant neoplasm of colon: Secondary | ICD-10-CM | POA: Diagnosis not present

## 2023-10-25 DIAGNOSIS — D124 Benign neoplasm of descending colon: Secondary | ICD-10-CM

## 2023-10-25 DIAGNOSIS — D128 Benign neoplasm of rectum: Secondary | ICD-10-CM | POA: Diagnosis not present

## 2023-10-25 DIAGNOSIS — K5730 Diverticulosis of large intestine without perforation or abscess without bleeding: Secondary | ICD-10-CM

## 2023-10-25 DIAGNOSIS — K635 Polyp of colon: Secondary | ICD-10-CM | POA: Diagnosis not present

## 2023-10-25 MED ORDER — SODIUM CHLORIDE 0.9 % IV SOLN: 500.0000 mL | INTRAVENOUS | Status: DC

## 2023-10-25 NOTE — Op Note (Signed)
 [facility] Patient Name: David Carrillo Procedure Date: 10/25/2023 3:06 PM MRN: [id number] Endoscopist: Gordy CHRISTELLA Starch , MD, [id number] Age: 63 Referring MD:  Date of Birth: 23-Feb-1960 Gender: Male Account #: [id number] Procedure:                Colonoscopy Indications:              Screening for colorectal malignant neoplasm, Last                            colonoscopy: 2014 Medicines:                Monitored Anesthesia Care Procedure:                Pre-Anesthesia Assessment:                           - Prior to the procedure, a History and Physical                            was performed, and patient medications and                            allergies were reviewed. The patient's tolerance of                            previous anesthesia was also reviewed. The risks                            and benefits of the procedure and the sedation                            options and risks were discussed with the patient.                            All questions were answered, and informed consent                            was obtained. Prior Anticoagulants: The patient has                            taken no anticoagulant or antiplatelet agents. ASA                            Grade Assessment: II - A patient with mild systemic                            disease. After reviewing the risks and benefits,                            the patient was deemed in satisfactory condition to                            undergo the procedure.  After obtaining informed consent, the colonoscope                            was passed under direct vision. Throughout the                            procedure, the patient's blood pressure, pulse, and                            oxygen saturations were monitored continuously. The                            Olympus Scope [id number] was introduced through the                            anus and advanced to the terminal  ileum. The                            colonoscopy was performed without difficulty. The                            patient tolerated the procedure well. The quality                            of the bowel preparation was good. The ileocecal                            valve, appendiceal orifice, and rectum were                            photographed. Scope In: 3:21:44 PM Scope Out: 3:34:49 PM Scope Withdrawal Time: 0 hours 11 minutes 26 seconds  Total Procedure Duration: 0 hours 13 minutes 5 seconds  Findings:                 The digital rectal exam was normal.                           A 5 mm polyp was found in the ascending colon. The                            polyp was sessile. The polyp was removed with a                            cold snare. Resection and retrieval were complete.                           Two sessile polyps were found in the transverse                            colon. The polyps were 4 to 5 mm in size. These                            polyps were removed  with a cold snare. Resection                            and retrieval were complete.                           A 4 mm polyp was found in the rectum. The polyp was                            sessile. The polyp was removed with a cold snare.                            Resection and retrieval were complete.                           Multiple medium-mouthed and small-mouthed                            diverticula were found in the sigmoid colon,                            descending colon and hepatic flexure.                           Internal hemorrhoids were found during                            retroflexion. The hemorrhoids were medium-sized. Complications:            No immediate complications. Estimated Blood Loss:     Estimated blood loss was minimal. Impression:               - One 5 mm polyp in the ascending colon, removed                            with a cold snare. Resected and retrieved.                            - Two 4 to 5 mm polyps in the transverse colon,                            removed with a cold snare. Resected and retrieved.                           - One 4 mm polyp in the rectum, removed with a cold                            snare. Resected and retrieved.                           - Moderate diverticulosis in the sigmoid colon, in                            the descending colon and at the hepatic flexure.                           -  Internal hemorrhoids. Recommendation:           - Patient has a contact number available for                            emergencies. The signs and symptoms of potential                            delayed complications were discussed with the                            patient. Return to normal activities tomorrow.                            Written discharge instructions were provided to the                            patient.                           - Resume previous diet.                           - Continue present medications.                           - Await pathology results.                           - Repeat colonoscopy is recommended for                            surveillance. The colonoscopy date will be                            determined after pathology results from today's                            exam become available for review. Gordy CHRISTELLA Starch, MD 10/25/2023 3:49:39 PM This report has been signed electronically.

## 2023-10-25 NOTE — Progress Notes (Signed)
 To PACU via stretcher, sedated, good respiratory effort, VSS.

## 2023-10-25 NOTE — Progress Notes (Unsigned)
 GASTROENTEROLOGY PROCEDURE H&P NOTE   Primary Care Physician: Orie Milda CROME, MD    Reason for Procedure:  Colon cancer screening  Plan:    Colonoscopy  Patient is appropriate for endoscopic procedure(s) in the ambulatory ([facility]) setting.  The nature of the procedure, as well as the risks, benefits, and alternatives were carefully and thoroughly reviewed with the patient. Ample time for discussion and questions allowed. The patient understood, was satisfied, and agreed to proceed.     HPI: David Carrillo is a 63 y.o. male who presents for screening colonoscopy.  Medical history as below.  Tolerated the prep.  No recent chest pain or shortness of breath.  No abdominal pain today.  Past Medical History:  Diagnosis Date   Allergy    Arthritis    GERD (gastroesophageal reflux disease)    Hyperlipidemia    Hypertension    borderline-no meds   Neuromuscular disorder (HCC)    some numbness rt hand-7/13    Past Surgical History:  Procedure Laterality Date   ABDOMINAL HERNIA REPAIR N/A    SHOULDER ARTHROSCOPY  3 years ago   SHOULDER SURGERY Right     Prior to Admission medications   Medication Sig Start Date End Date Taking? Authorizing Provider  aspirin  EC 81 MG tablet Take 1 tablet (81 mg total) by mouth daily. Swallow whole. 05/06/20  Yes Hobart Powell BRAVO, MD  atorvastatin  (LIPITOR) 40 MG tablet Take 1 tablet (40 mg total) by mouth daily. 09/22/23  Yes Pruett, Milda CROME, MD  indapamide  (LOZOL ) 1.25 MG tablet Take 1 tablet (1.25 mg total) by mouth daily. 09/22/23  Yes Pruett, Milda CROME, MD  omeprazole  (PRILOSEC OTC) 20 MG tablet Take 20 mg by mouth daily.   Yes [provider]  valsartan  (DIOVAN ) 80 MG tablet Take 1 tablet (80 mg total) by mouth daily. 09/22/23  Yes Pruett, Milda CROME, MD    Current Outpatient Medications  Medication Sig Dispense Refill   aspirin  EC 81 MG tablet Take 1 tablet (81 mg total) by mouth daily. Swallow whole. 90 tablet 3    atorvastatin  (LIPITOR) 40 MG tablet Take 1 tablet (40 mg total) by mouth daily. 90 tablet 5   indapamide  (LOZOL ) 1.25 MG tablet Take 1 tablet (1.25 mg total) by mouth daily. 90 tablet 5   omeprazole  (PRILOSEC OTC) 20 MG tablet Take 20 mg by mouth daily.     valsartan  (DIOVAN ) 80 MG tablet Take 1 tablet (80 mg total) by mouth daily. 90 tablet 3   Current Facility-Administered Medications  Medication Dose Route Frequency Provider Last Rate Last Admin   0.9 %  sodium chloride  infusion  500 mL Intravenous Continuous Pattijo Juste, Gordy HERO, MD        Allergies as of 10/25/2023   (No Known Allergies)    Family History  Problem Relation Age of Onset   Colon cancer Neg Hx    Esophageal cancer Neg Hx    Rectal cancer Neg Hx    Stomach cancer Neg Hx     Social History   Socioeconomic History   Marital status: Married    Spouse name: Not on file   Number of children: Not on file   Years of education: Not on file   Highest education level: Not on file  Occupational History   Not on file  Tobacco Use   Smoking status: Former    Current packs/day: 0.00    Types: Cigarettes    Quit date: 08/05/2002    Years  since quitting: 21.2   Smokeless tobacco: Never  Substance and Sexual Activity   Alcohol use: No   Drug use: No   Sexual activity: Not on file  Other Topics Concern   Not on file  Social History Narrative   Not on file   Social Drivers of Health   Financial Resource Strain: Not on file  Food Insecurity: Not on file  Transportation Needs: Not on file  Physical Activity: Not on file  Stress: Not on file  Social Connections: Not on file  Intimate Partner Violence: Not on file    Physical Exam: Vital signs in last 24 hours: @BP  (!) 161/95   Pulse 90   Temp 98.2 F (36.8 C) (Temporal)   Ht 5' 10 (1.778 m)   Wt 215 lb (97.5 kg)   SpO2 96%   BMI 30.85 kg/m  GEN: NAD EYE: Sclerae anicteric ENT: MMM CV: Non-tachycardic Pulm: CTA b/l GI: Soft, NT/ND NEURO:  Alert &  Oriented x 3   Gordy Starch, MD [facility]  10/25/2023 3:10 PM

## 2023-10-25 NOTE — Progress Notes (Unsigned)
 Pt's states no medical or surgical changes since previsit or office visit.

## 2023-10-25 NOTE — Patient Instructions (Signed)

## 2023-10-25 NOTE — Progress Notes (Unsigned)
 Called to room to assist during endoscopic procedure.  Patient ID and intended procedure confirmed with present staff. Received instructions for my participation in the procedure from the performing physician.

## 2023-10-26 ENCOUNTER — Telehealth: Payer: Self-pay

## 2023-10-26 NOTE — Telephone Encounter (Signed)
  Follow up Call-     10/25/2023    2:40 PM  Call back number  Post procedure Call Back phone  # [phone]  Permission to leave phone message Yes     Patient questions:  Do you have a fever, pain , or abdominal swelling? No. Pain Score  0 *  Have you tolerated food without any problems? Yes.    Have you been able to return to your normal activities? Yes.    Do you have any questions about your discharge instructions: Diet   No. Medications  No. Follow up visit  No.  Do you have questions or concerns about your Care? No.  Actions: * If pain score is 4 or above: No action needed, pain <4.

## 2023-10-28 LAB — SURGICAL PATHOLOGY

## 2023-10-31 ENCOUNTER — Ambulatory Visit: Payer: Self-pay | Admitting: Internal Medicine
